# Patient Record
Sex: Female | Born: 1950 | Race: Black or African American | Hispanic: No | Marital: Married | Smoking: Current every day smoker
Health system: Southern US, Community
[De-identification: ages and names within clinical notes are randomized; demographics above are authoritative.]

## PROBLEM LIST (undated history)

## (undated) DIAGNOSIS — I10 Essential (primary) hypertension: Secondary | ICD-10-CM

## (undated) HISTORY — PX: ABDOMINAL HYSTERECTOMY: SHX81

---

## 2005-11-19 ENCOUNTER — Emergency Department (HOSPITAL_COMMUNITY): Admission: EM | Admit: 2005-11-19 | Discharge: 2005-11-19 | Payer: Self-pay | Admitting: Family Medicine

## 2005-11-19 ENCOUNTER — Emergency Department (HOSPITAL_COMMUNITY): Admission: EM | Admit: 2005-11-19 | Discharge: 2005-11-19 | Payer: Self-pay | Admitting: Emergency Medicine

## 2006-05-18 ENCOUNTER — Emergency Department (HOSPITAL_COMMUNITY): Admission: EM | Admit: 2006-05-18 | Discharge: 2006-05-18 | Payer: Self-pay | Admitting: Emergency Medicine

## 2010-05-10 ENCOUNTER — Emergency Department (HOSPITAL_COMMUNITY)
Admission: EM | Admit: 2010-05-10 | Discharge: 2010-05-10 | Payer: Self-pay | Source: Home / Self Care | Admitting: Family Medicine

## 2012-04-21 ENCOUNTER — Encounter (HOSPITAL_COMMUNITY): Payer: Self-pay | Admitting: *Deleted

## 2012-04-21 ENCOUNTER — Emergency Department (HOSPITAL_COMMUNITY)
Admission: EM | Admit: 2012-04-21 | Discharge: 2012-04-21 | Disposition: A | Discharge status: Home / Self Care | Payer: 59 | Attending: Emergency Medicine | Admitting: Emergency Medicine

## 2012-04-21 ENCOUNTER — Emergency Department (HOSPITAL_COMMUNITY): Payer: 59

## 2012-04-21 DIAGNOSIS — F17200 Nicotine dependence, unspecified, uncomplicated: Secondary | ICD-10-CM | POA: Insufficient documentation

## 2012-04-21 DIAGNOSIS — M25569 Pain in unspecified knee: Secondary | ICD-10-CM | POA: Insufficient documentation

## 2012-04-21 DIAGNOSIS — M25562 Pain in left knee: Secondary | ICD-10-CM

## 2012-04-21 DIAGNOSIS — I10 Essential (primary) hypertension: Secondary | ICD-10-CM | POA: Insufficient documentation

## 2012-04-21 HISTORY — DX: Essential (primary) hypertension: I10

## 2012-04-21 MED ORDER — IBUPROFEN 600 MG PO TABS: 600.0000 mg | ORAL_TABLET | Freq: Four times a day (QID) | ORAL | Status: DC | PRN

## 2012-04-21 MED ORDER — KETOROLAC TROMETHAMINE 60 MG/2ML IM SOLN
60.0000 mg | Freq: Once | INTRAMUSCULAR | Status: AC
  Administered 2012-04-21: 60 mg via INTRAMUSCULAR
  Filled 2012-04-21: qty 2

## 2012-04-21 NOTE — Progress Notes (Signed)
Orthopedic Tech Progress Note Patient Details:  Casey Levy 05/17/1951 [id number]  Ortho Devices Type of Ortho Device: Crutches;Knee Sleeve Ortho Device/Splint Location: right leg Ortho Device/Splint Interventions: Application   Nikki Dom 04/21/2012, 9:56 PM

## 2012-04-21 NOTE — ED Notes (Signed)
Pt is here with left knee pain and now hard to bear weight on it at all

## 2012-04-21 NOTE — ED Provider Notes (Signed)
History   This chart was scribed for Loren Racer, MD by Gerlean Ren, ED Scribe. This patient was seen in room TR09C/TR09C and the patient's care was started at 7:58 PM    CSN: [id number]  Arrival date & time 04/21/12  1758   First MD Initiated Contact with Patient 04/21/12 1946      Chief Complaint  Patient presents with  . Knee Pain     The history is provided by the patient. No language interpreter was used.   Casey Levy is a 61 y.o. female who presents to the Emergency Department complaining of 3-4 weeks of new, moderate, non-radiating left knee pain that has been worsening over past 2-3 days and is worsened by ambulation and mildly improved by rest.  Pt denies any injury, fall, or trauma as cause.  Pt has a h/o HTN.  Pt is a current everyday smoker and reports occasional alcohol use.   Past Medical History  Diagnosis Date  . Hypertension     Past Surgical History  Procedure Date  . Abdominal hysterectomy     No family history on file.  History  Substance Use Topics  . Smoking status: Current Every Day Smoker  . Smokeless tobacco: Not on file  . Alcohol Use: Yes     Comment: occ    No OB history provided.  Review of Systems  Musculoskeletal:       Left knee pain  Neurological: Negative for weakness and numbness.    Allergies  Review of patient's allergies indicates no known allergies.  Home Medications   Current Outpatient Rx  Name  Route  Sig  Dispense  Refill  . DICLOFENAC SODIUM ER 100 MG PO TB24   Oral   Take 100 mg by mouth daily.         Marland Kitchen HYDROCODONE-IBUPROFEN 5-200 MG PO TABS   Oral   Take 1 tablet by mouth every 8 (eight) hours as needed. For pain         . IBUPROFEN 600 MG PO TABS   Oral   Take 1 tablet (600 mg total) by mouth every 6 (six) hours as needed for pain.   30 tablet   0     BP 171/103  Pulse 87  Temp 97.6 F (36.4 C) (Oral)  Resp 18  SpO2 97%  Physical Exam  Nursing note and vitals  reviewed. Constitutional: She is oriented to person, place, and time. She appears well-developed and well-nourished.  HENT:  Head: Normocephalic and atraumatic.  Eyes: Conjunctivae normal and EOM are normal. Pupils are equal, round, and reactive to light.  Neck: Normal range of motion. Neck supple.  Cardiovascular: Normal rate, regular rhythm and normal heart sounds.        Distal pulses intact.  Pulmonary/Chest: Effort normal and breath sounds normal.  Abdominal: Soft. Bowel sounds are normal.  Musculoskeletal: Normal range of motion.       Crepitance with left knee ROM.  Mild tenderness around knee.  No obvious swelling or deformity.   Left calf soft and non-tender.  Neurological: She is alert and oriented to person, place, and time.  Skin: Skin is warm and dry.  Psychiatric: She has a normal mood and affect.    ED Course  Procedures (including critical care time) DIAGNOSTIC STUDIES: Oxygen Saturation is 97% on room air, adequate by my interpretation.    COORDINATION OF CARE: 8:01 PM- Patient informed of clinical course, understands medical decision-making process, and agrees with plan.  Ordered  IM Toradol and left knee XR.        Labs Reviewed - No data to display Dg Knee Complete 4 Views Left  04/21/2012  *RADIOLOGY REPORT*  Clinical Data: Pain weight bearing  LEFT KNEE - COMPLETE 4+ VIEW  Comparison: None.  Findings: No effusion. Negative for fracture, dislocation, or other acute abnormality.  Normal alignment and mineralization. No significant degenerative change.  Regional soft tissues unremarkable.  IMPRESSION:  Negative   Original Report Authenticated By: D. Andria Rhein, MD      1. Knee pain, left       MDM  I personally performed the services described in this documentation, which was scribed in my presence. The recorded information has been reviewed and is accurate.       Loren Racer, MD 04/21/12 2104

## 2014-04-05 IMAGING — CR DG KNEE COMPLETE 4+V*L*
4 series · 4 of 4 positions shown · non-contrast
Comparison: None.

CLINICAL DATA: Pain weight bearing

LEFT KNEE - COMPLETE 4+ VIEW

[t knee ap left]
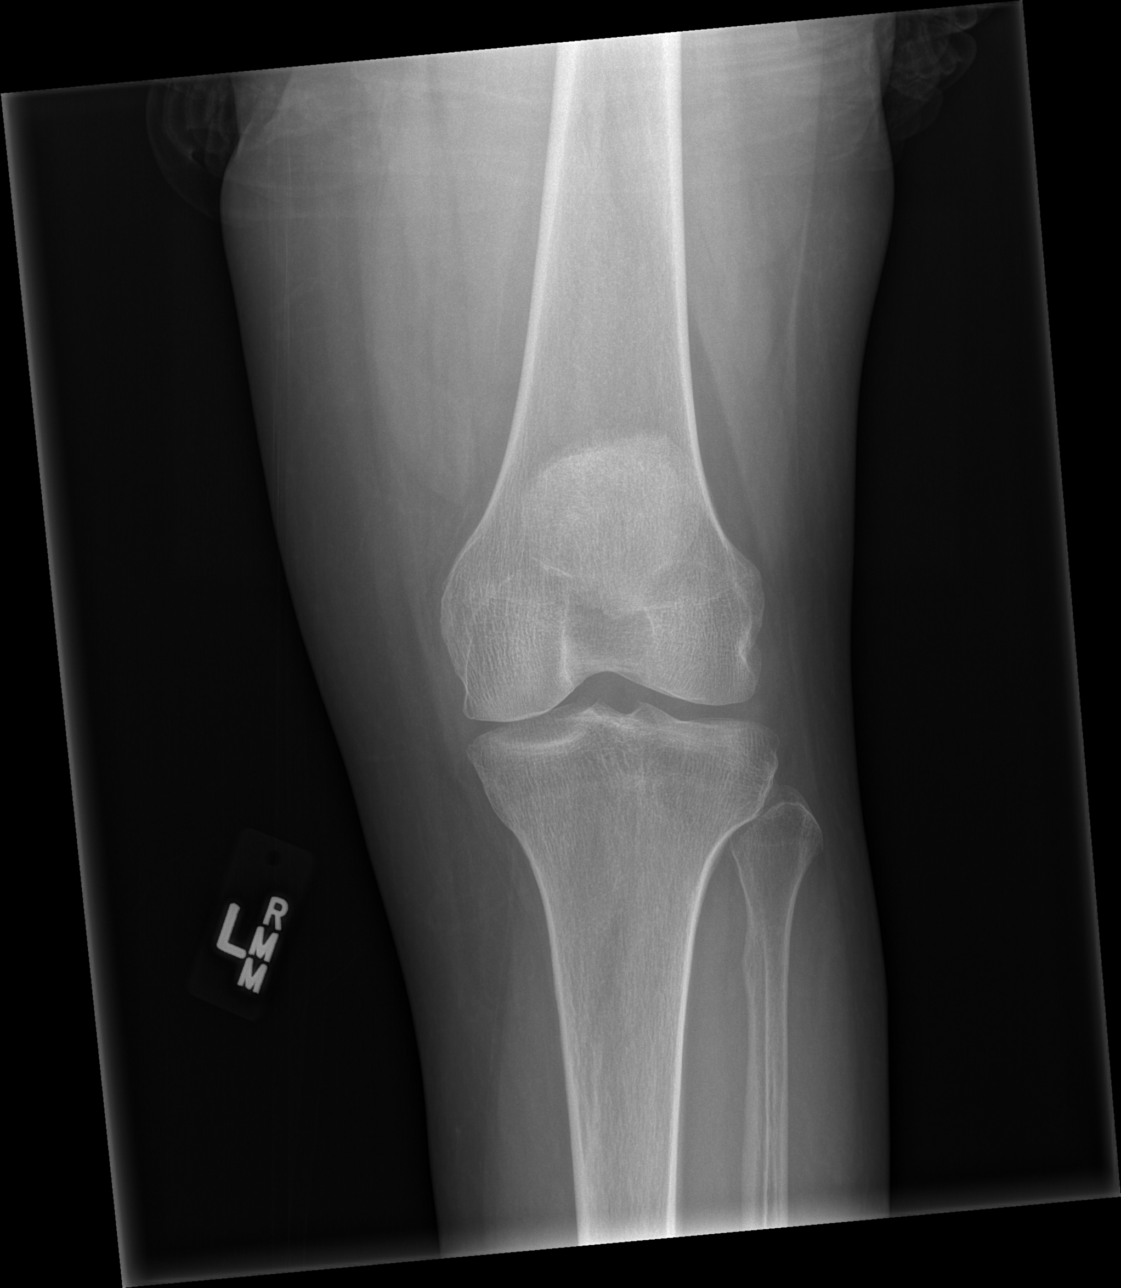

[t knee obl left (1 of 2)]
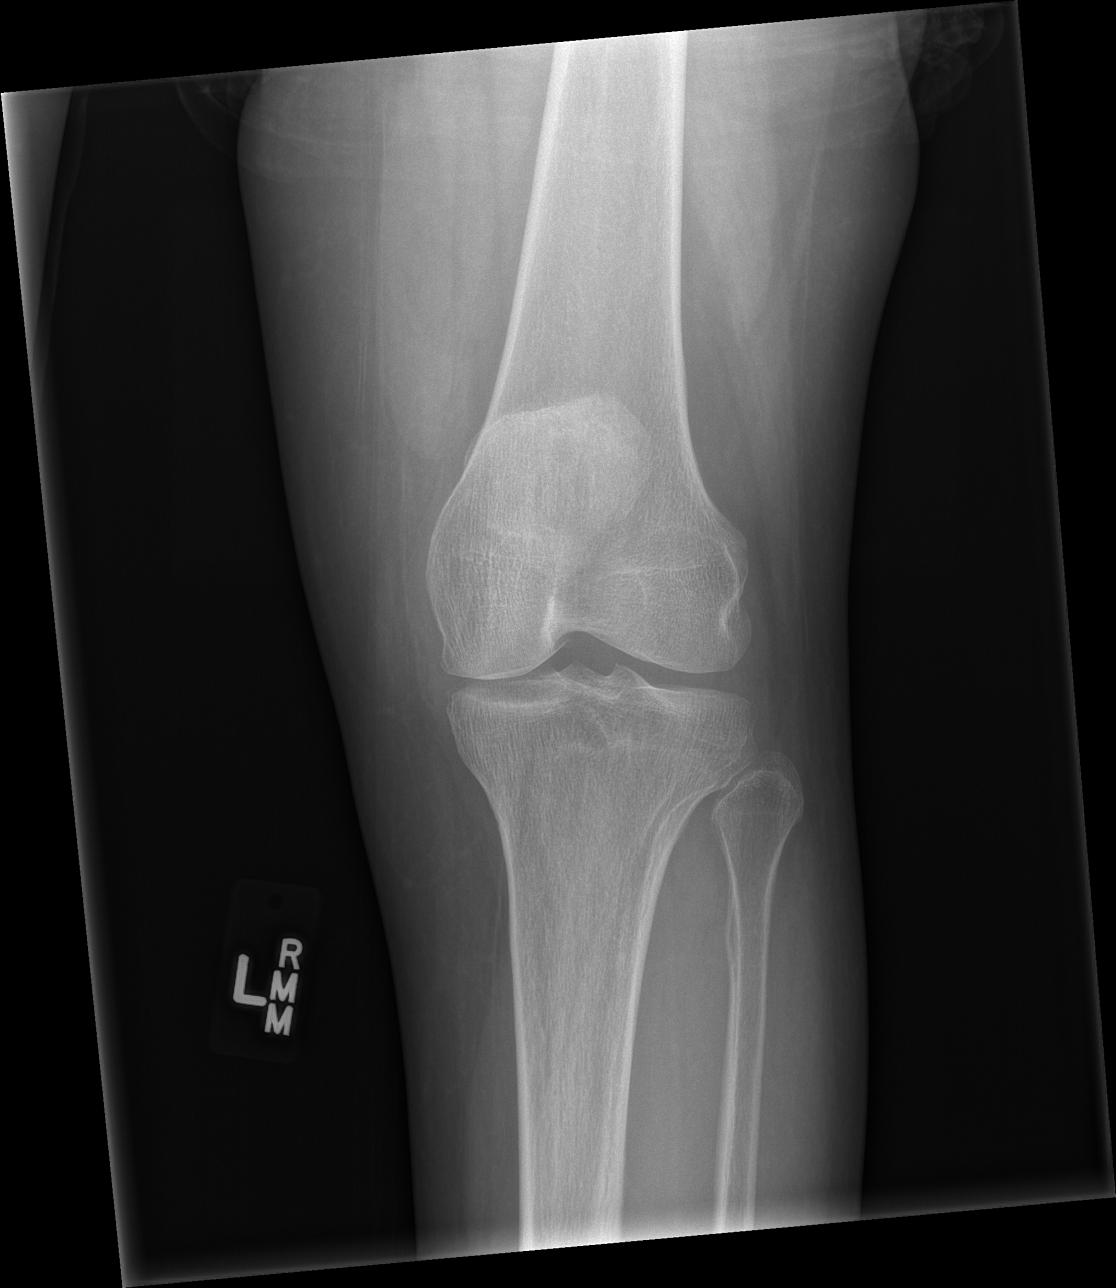

[t knee obl left (2 of 2)]
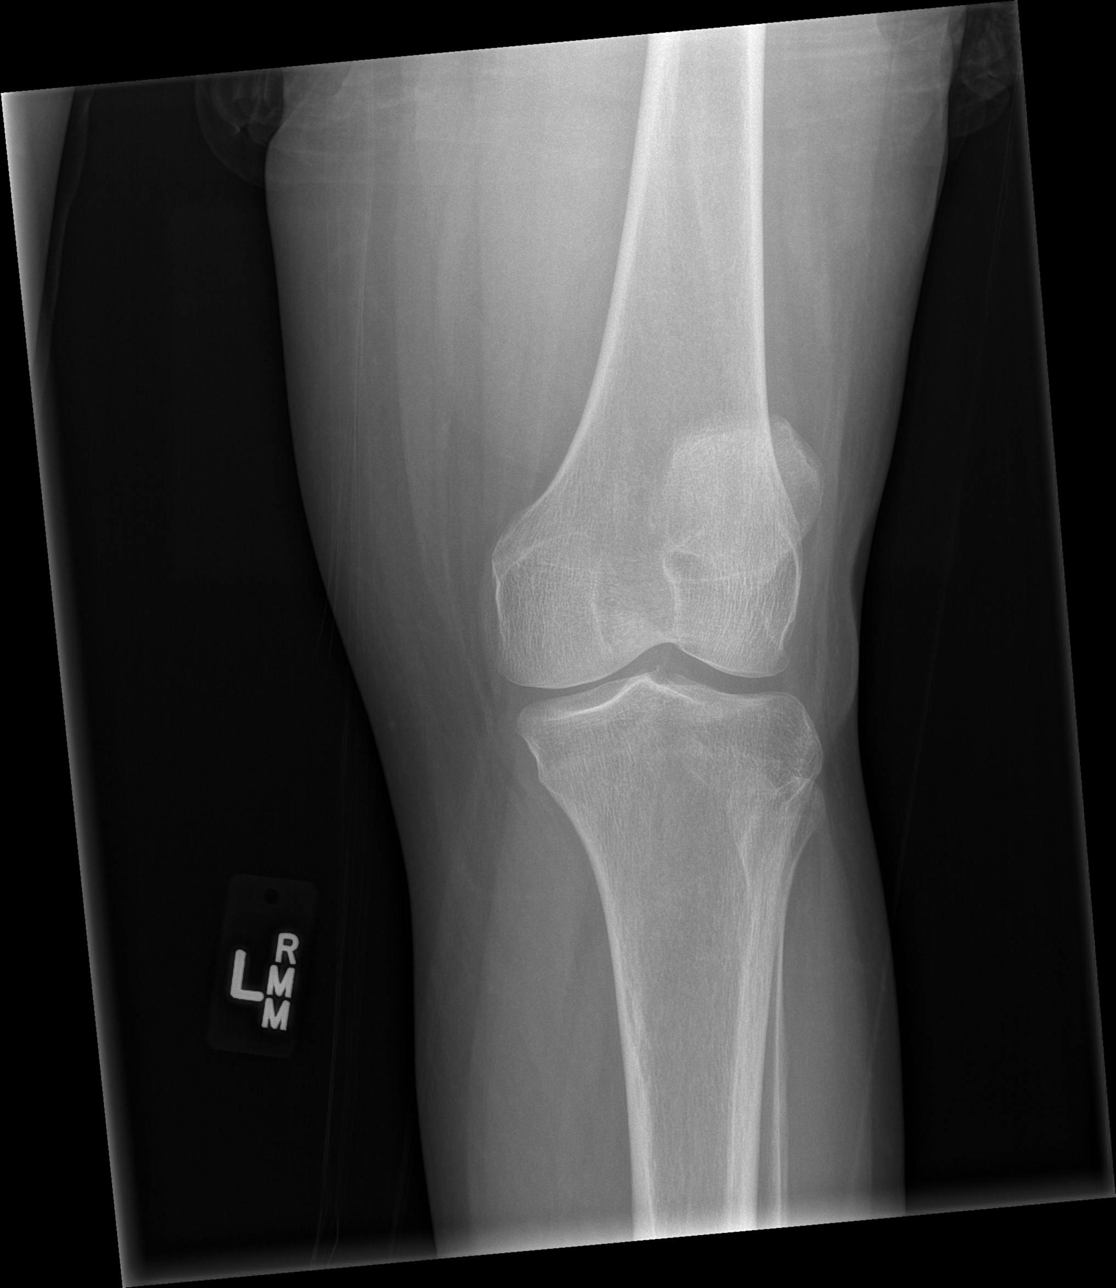

[t knee lat left]
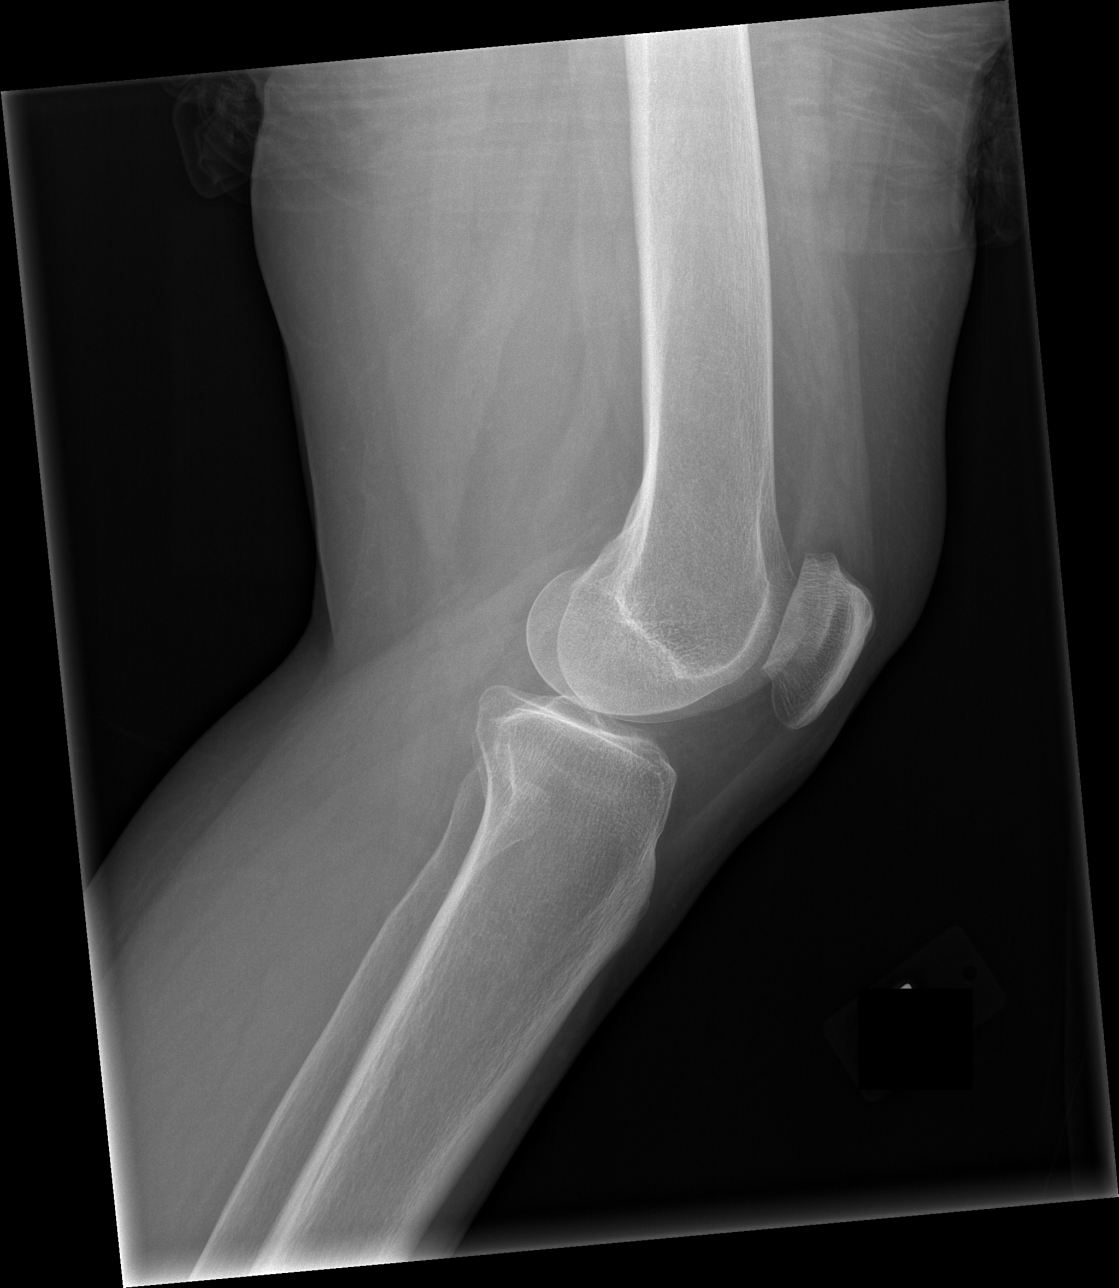

[4 of 4 positions shown; findings below may reference images not displayed]

FINDINGS: No effusion. Negative for fracture, dislocation, or other
acute abnormality.  Normal alignment and mineralization. No
significant degenerative change.  Regional soft tissues
unremarkable.
IMPRESSION: Negative

## 2015-07-30 ENCOUNTER — Encounter (HOSPITAL_COMMUNITY): Payer: Self-pay | Admitting: Emergency Medicine

## 2015-07-30 ENCOUNTER — Emergency Department (HOSPITAL_COMMUNITY)
Admission: EM | Admit: 2015-07-30 | Discharge: 2015-07-30 | Disposition: A | Discharge status: Home / Self Care | Payer: 59 | Attending: Emergency Medicine | Admitting: Emergency Medicine

## 2015-07-30 DIAGNOSIS — M79652 Pain in left thigh: Secondary | ICD-10-CM | POA: Diagnosis not present

## 2015-07-30 DIAGNOSIS — F17210 Nicotine dependence, cigarettes, uncomplicated: Secondary | ICD-10-CM | POA: Insufficient documentation

## 2015-07-30 DIAGNOSIS — I10 Essential (primary) hypertension: Secondary | ICD-10-CM | POA: Diagnosis not present

## 2015-07-30 DIAGNOSIS — R202 Paresthesia of skin: Secondary | ICD-10-CM | POA: Diagnosis present

## 2015-07-30 DIAGNOSIS — Z79899 Other long term (current) drug therapy: Secondary | ICD-10-CM | POA: Diagnosis not present

## 2015-07-30 MED ORDER — NAPROXEN 500 MG PO TABS: 500.0000 mg | ORAL_TABLET | Freq: Two times a day (BID) | ORAL | Status: DC

## 2015-07-30 MED ORDER — AMLODIPINE BESYLATE 5 MG PO TABS: 5.0000 mg | ORAL_TABLET | Freq: Every day | ORAL | Status: DC

## 2015-07-30 NOTE — ED Provider Notes (Signed)
CSN: [id number]     Arrival date & time 07/30/15  4098 History   First MD Initiated Contact with Patient 07/30/15 [phone]     Chief Complaint  Patient presents with  . Leg warmth      (Consider location/radiation/quality/duration/timing/severity/associated sxs/prior Treatment) HPI Comments: Patient is a 65 year old female with history of hypertension. She presents today with a two-week history of intermittent warm sensation to the inside of her left thigh. She denies any fevers or chills. She denies any injury or trauma. She denies any redness. She denies any bowel or bladder complaints and denies any weakness or numbness to her extremities.  The history is provided by the patient.    Past Medical History  Diagnosis Date  . Hypertension    Past Surgical History  Procedure Laterality Date  . Abdominal hysterectomy     History reviewed. No pertinent family history. Social History  Substance Use Topics  . Smoking status: Current Every Day Smoker -- 0.50 packs/day    Types: Cigarettes  . Smokeless tobacco: Never Used  . Alcohol Use: No     Comment: No alcohol since July 2015   OB History    No data available     Review of Systems  All other systems reviewed and are negative.     Allergies  Review of patient's allergies indicates no known allergies.  Home Medications   Prior to Admission medications   Medication Sig Start Date End Date Taking? Authorizing Provider  Diclofenac Sodium CR 100 MG 24 hr tablet Take 100 mg by mouth daily.    Historical Provider, MD  hydrocodone-ibuprofen (VICOPROFEN) 5-200 MG per tablet Take 1 tablet by mouth every 8 (eight) hours as needed. For pain    Historical Provider, MD  ibuprofen (ADVIL,MOTRIN) 600 MG tablet Take 1 tablet (600 mg total) by mouth every 6 (six) hours as needed for pain. 04/21/12   Loren Racer, MD   BP 172/123 mmHg  Pulse 102  Temp(Src) 97.9 F (36.6 C) (Oral)  Resp 20  SpO2 99% Physical Exam  Constitutional:  She is oriented to person, place, and time. She appears well-developed and well-nourished. No distress.  HENT:  Head: Normocephalic and atraumatic.  Neck: Normal range of motion. Neck supple.  Cardiovascular: Normal rate and regular rhythm.  Exam reveals no gallop and no friction rub.   No murmur heard. Pulmonary/Chest: Effort normal and breath sounds normal. No respiratory distress. She has no wheezes.  Abdominal: Soft. Bowel sounds are normal. She exhibits no distension. There is no tenderness.  Musculoskeletal: Normal range of motion.  The inside of the left thigh appears grossly normal. There is no redness or erythema. There is no fluctuance, swelling, or other abnormality. DP and PT pulses are intact. Motor and sensation are intact in the entire leg. There is swelling or tenderness, and Homans sign is absent.  Neurological: She is alert and oriented to person, place, and time.  Skin: Skin is warm and dry. She is not diaphoretic.  Nursing note and vitals reviewed.   ED Course  Procedures (including critical care time) Labs Review Labs Reviewed - No data to display  Imaging Review No results found. I have personally reviewed and evaluated these images and lab results as part of my medical decision-making.    MDM   Final diagnoses:  None    Patient presents with complaints of warm sensation to the inside of her left thigh. Her exam is essentially normal with good distal circulation and is neurologically  intact. I am uncertain as to what is causing her symptoms, however this will be treated with an anti-inflammatory, rest, and see her in return. I'm quite certain this is not a DVT. Her blood pressure is elevated here in the emergency department. She has a history of hypertension, however has not been taking her Cardizem because she does not like the way it makes her feel. I will have her stop this and change the prescription to amlodipine.    Geoffery Lyons, MD 07/30/15 [phone]

## 2015-07-30 NOTE — ED Notes (Signed)
Pt verbalized understanding of d/c instructions, prescriptions, and follow-up care. No further questions/concerns, VSS, ambulatory w/ steady gait (refused wheelchair) 

## 2015-07-30 NOTE — ED Notes (Signed)
Pt from home with c/o intermittent inner thigh warmth x 2 weeks, approx 5 times a day, lasting 4-5 seconds.  Denies any pain or tingling.  Pt reports returning headache s/p flu starting last night.  No other symptoms returned.  No warmness to thigh at this time.  Headache gone due to taking 1 g tylenol this morning around 3 am.  NAD, A&O.

## 2015-07-30 NOTE — Discharge Instructions (Signed)
Stop taking Cardizem.  Start taking amlodipine.  Naproxen twice daily as prescribed.  Follow up with her primary doctor if not improving in the next week, and return to the ER if symptoms significantly worsen or change.   Hypertension Hypertension, commonly called high blood pressure, is when the force of blood pumping through your arteries is too strong. Your arteries are the blood vessels that carry blood from your heart throughout your body. A blood pressure reading consists of a higher number over a lower number, such as 110/72. The higher number (systolic) is the pressure inside your arteries when your heart pumps. The lower number (diastolic) is the pressure inside your arteries when your heart relaxes. Ideally you want your blood pressure below 120/80. Hypertension forces your heart to work harder to pump blood. Your arteries may become narrow or stiff. Having untreated or uncontrolled hypertension can cause heart attack, stroke, kidney disease, and other problems. RISK FACTORS Some risk factors for high blood pressure are controllable. Others are not.  Risk factors you cannot control include:   Race. You may be at higher risk if you are African American.  Age. Risk increases with age.  Gender. Men are at higher risk than women before age 67 years. After age 46, women are at higher risk than men. Risk factors you can control include:  Not getting enough exercise or physical activity.  Being overweight.  Getting too much fat, sugar, calories, or salt in your diet.  Drinking too much alcohol. SIGNS AND SYMPTOMS Hypertension does not usually cause signs or symptoms. Extremely high blood pressure (hypertensive crisis) may cause headache, anxiety, shortness of breath, and nosebleed. DIAGNOSIS To check if you have hypertension, your health care provider will measure your blood pressure while you are seated, with your arm held at the level of your heart. It should be measured at  least twice using the same arm. Certain conditions can cause a difference in blood pressure between your right and left arms. A blood pressure reading that is higher than normal on one occasion does not mean that you need treatment. If it is not clear whether you have high blood pressure, you may be asked to return on a different day to have your blood pressure checked again. Or, you may be asked to monitor your blood pressure at home for 1 or more weeks. TREATMENT Treating high blood pressure includes making lifestyle changes and possibly taking medicine. Living a healthy lifestyle can help lower high blood pressure. You may need to change some of your habits. Lifestyle changes may include:  Following the DASH diet. This diet is high in fruits, vegetables, and whole grains. It is low in salt, red meat, and added sugars.  Keep your sodium intake below 2,300 mg per day.  Getting at least 30-45 minutes of aerobic exercise at least 4 times per week.  Losing weight if necessary.  Not smoking.  Limiting alcoholic beverages.  Learning ways to reduce stress. Your health care provider may prescribe medicine if lifestyle changes are not enough to get your blood pressure under control, and if one of the following is true:  You are 2-53 years of age and your systolic blood pressure is above 140.  You are 96 years of age or older, and your systolic blood pressure is above 150.  Your diastolic blood pressure is above 90.  You have diabetes, and your systolic blood pressure is over 140 or your diastolic blood pressure is over 90.  You have kidney  disease and your blood pressure is above 140/90.  You have heart disease and your blood pressure is above 140/90. Your personal target blood pressure may vary depending on your medical conditions, your age, and other factors. HOME CARE INSTRUCTIONS  Have your blood pressure rechecked as directed by your health care provider.   Take medicines only as  directed by your health care provider. Follow the directions carefully. Blood pressure medicines must be taken as prescribed. The medicine does not work as well when you skip doses. Skipping doses also puts you at risk for problems.  Do not smoke.   Monitor your blood pressure at home as directed by your health care provider. SEEK MEDICAL CARE IF:   You think you are having a reaction to medicines taken.  You have recurrent headaches or feel dizzy.  You have swelling in your ankles.  You have trouble with your vision. SEEK IMMEDIATE MEDICAL CARE IF:  You develop a severe headache or confusion.  You have unusual weakness, numbness, or feel faint.  You have severe chest or abdominal pain.  You vomit repeatedly.  You have trouble breathing. MAKE SURE YOU:   Understand these instructions.  Will watch your condition.  Will get help right away if you are not doing well or get worse.   This information is not intended to replace advice given to you by your health care provider. Make sure you discuss any questions you have with your health care provider.   Document Released: 05/21/2005 Document Revised: 10/05/2014 Document Reviewed: 03/13/2013 Elsevier Interactive Patient Education [employer]

## 2016-02-21 ENCOUNTER — Ambulatory Visit: Payer: Medicare Other | Admitting: Family Medicine

## 2016-06-13 ENCOUNTER — Encounter: Payer: Self-pay | Admitting: Student

## 2016-06-13 ENCOUNTER — Ambulatory Visit: Payer: Medicare Other | Admitting: Family Medicine

## 2016-06-13 ENCOUNTER — Ambulatory Visit (INDEPENDENT_AMBULATORY_CARE_PROVIDER_SITE_OTHER): Payer: 59 | Admitting: Student

## 2016-06-13 ENCOUNTER — Other Ambulatory Visit: Payer: Self-pay | Admitting: Family Medicine

## 2016-06-13 VITALS — BP 158/90 | HR 90 | Temp 98.1°F | Ht 63.0 in | Wt 176.0 lb

## 2016-06-13 DIAGNOSIS — F411 Generalized anxiety disorder: Secondary | ICD-10-CM | POA: Diagnosis not present

## 2016-06-13 DIAGNOSIS — Z0000 Encounter for general adult medical examination without abnormal findings: Secondary | ICD-10-CM | POA: Diagnosis not present

## 2016-06-13 DIAGNOSIS — I10 Essential (primary) hypertension: Secondary | ICD-10-CM | POA: Diagnosis not present

## 2016-06-13 MED ORDER — SERTRALINE HCL 25 MG PO TABS: 25.0000 mg | ORAL_TABLET | Freq: Every day | ORAL | 2 refills | Status: DC

## 2016-06-13 MED ORDER — ASPIRIN EC 81 MG PO TBEC: 81.0000 mg | DELAYED_RELEASE_TABLET | Freq: Every day | ORAL | 3 refills | Status: AC

## 2016-06-13 NOTE — Patient Instructions (Signed)
Follow up in 2 weeks for Depression Please take Aspirin, Amlodipine and Zoloft daily Please obtain your labs If you have qaestions or concerns, call the office at [phone]

## 2016-06-13 NOTE — Progress Notes (Signed)
   Subjective:    Patient ID: Casey Levy, female    DOB: Mar 18, 1951, 66 y.o.   MRN: [id number]   CC: establish care, anxiety, high blood pressure  HPI: 66 y/o female presenting to establish care. She reports a history of high blood pressure and reports worsening anxiety  Hypertension - previously treated with diltiazem and amlodipine 5 mg - she self d'ced the diltiazem one year ago but continues to take amlodipine - she denies headache, chest pain, SOB, changes in visin - she does not check her BP at home  Anxiety - she reports anxiety as a long term issue that has been progressively worsening - she feels her anxiety make sit difficult for her to perform at her job - she denies low mood or SI/HI but is interested in staring medication for this GAD 7- 18 PHQ9-3  Smoking status reviewed 0.5 ppd smoker  Review of Systems  Per HPI, else denies recent illness, fever,  abdominal pain, N/V/D  Past Medical History:  Diagnosis Date  . Hypertension    Past Surgical History:  Procedure Laterality Date  . ABDOMINAL HYSTERECTOMY     Family History  Problem Relation Age of Onset  . Diabetes Brother    OB Hx - [id number], s/p two term vaginal deliveries  Social History  Substance Use Topics  . Smoking status: Current Every Day Smoker    Packs/day: 0.50    Types: Cigarettes  . Smokeless tobacco: Never Used  . Alcohol use 4.2 - 6.0 oz/week    7 - 10 Glasses of wine per week   Works as a International aid/development workerCAT driver   Objective:  BP (!) 158/90   Pulse 90   Temp 98.1 F (36.7 C) (Oral)   Ht 5\' 3"  (1.6 m)   Wt 176 lb (79.8 kg)   SpO2 96%   BMI 31.18 kg/m  Vitals and nursing note reviewed  General: NAD Cardiac: RRR Respiratory: CTAB, normal effort Extremities: no edema or cyanosis. WWP. Skin: warm and dry, no rashes noted Neuro: alert and oriented, no focal deficits   Assessment & Plan:    Generalized anxiety disorder GAD7- 18 with patient reported impacted quality of life -  will start Zoloft 25 and follow up in 2 weeks - will offer BH to help  Essential hypertension BP elevated to 158/90 in the office, above goal while taking Norvasc 5 mg. She did take it this AM - will increase norvasc to 10 - BMP today - follow up in 1 week for BP check  Healthcare maintenance Will start aspirin today, lipid panel ordered - flu offered but she declined - due for mammo and colonoscopy, which she desired to discuss at later visits - due for hep C, Pneumnia vaccine, Dexa, Tdap - will continue to discuss these at future visits    Pamula Luther A. Kennon RoundsHaney MD, MS Family Medicine Resident PGY-3 Pager [phone]

## 2016-06-14 ENCOUNTER — Other Ambulatory Visit: Payer: Self-pay | Admitting: Student

## 2016-06-14 ENCOUNTER — Encounter: Payer: Self-pay | Admitting: Student

## 2016-06-14 DIAGNOSIS — I10 Essential (primary) hypertension: Secondary | ICD-10-CM | POA: Insufficient documentation

## 2016-06-14 DIAGNOSIS — Z0000 Encounter for general adult medical examination without abnormal findings: Secondary | ICD-10-CM | POA: Insufficient documentation

## 2016-06-14 DIAGNOSIS — F411 Generalized anxiety disorder: Secondary | ICD-10-CM | POA: Insufficient documentation

## 2016-06-14 MED ORDER — AMLODIPINE BESYLATE 5 MG PO TABS: 10.0000 mg | ORAL_TABLET | Freq: Every day | ORAL | 2 refills | Status: DC

## 2016-06-14 MED ORDER — AMLODIPINE BESYLATE 5 MG PO TABS: 5.0000 mg | ORAL_TABLET | Freq: Every day | ORAL | 2 refills | Status: DC

## 2016-06-14 NOTE — Telephone Encounter (Signed)
Patient came in stated [facility] [place] [address] [place] on amlodipine. Patient stated Dr. Was going to increase the dose on amlodipine.  Please follow up with patient [phone]

## 2016-06-14 NOTE — Telephone Encounter (Signed)
Norvasc called in to continue 10 mg. Patient called to inform her that this was being called in. She will need a BP check in one week,. Please call schedule one. Patient is aware

## 2016-06-14 NOTE — Assessment & Plan Note (Signed)
Will start aspirin today, lipid panel ordered - flu offered but she declined - due for mammo and colonoscopy, which she desired to discuss at later visits - due for hep C, Pneumnia vaccine, Dexa, Tdap - will continue to discuss these at future visits

## 2016-06-14 NOTE — Telephone Encounter (Signed)
Scheduled BP check for 1/18, pt is aware.

## 2016-06-14 NOTE — Telephone Encounter (Signed)
Looks like rx was not called in, please advise.

## 2016-06-14 NOTE — Assessment & Plan Note (Signed)
BP elevated to 158/90 in the office, above goal while taking Norvasc 5 mg. She did take it this AM - will increase norvasc to 10 - BMP today - follow up in 1 week for BP check

## 2016-06-14 NOTE — Assessment & Plan Note (Signed)
GAD7- 18 with patient reported impacted quality of life - will start Zoloft 25 and follow up in 2 weeks - will offer BH to help

## 2016-06-26 ENCOUNTER — Encounter: Payer: Self-pay | Admitting: Student

## 2016-06-27 ENCOUNTER — Ambulatory Visit (INDEPENDENT_AMBULATORY_CARE_PROVIDER_SITE_OTHER): Payer: 59 | Admitting: Family Medicine

## 2016-06-27 ENCOUNTER — Encounter: Payer: Self-pay | Admitting: Family Medicine

## 2016-06-27 DIAGNOSIS — F411 Generalized anxiety disorder: Secondary | ICD-10-CM

## 2016-06-27 DIAGNOSIS — I10 Essential (primary) hypertension: Secondary | ICD-10-CM | POA: Diagnosis not present

## 2016-06-27 NOTE — Progress Notes (Signed)
    Subjective:  Casey Levy is a 66 y.o. female who [facility] with a chief complaint of HTN.   HPI:  Hypertension Patient [facility]. BP was elevated at that time to 158/90 and patient's norvasc was increased from 5mg  to 10mg .    BP Readings from Last 3 Encounters:  06/27/16 140/90  06/13/16 (!) 158/90  07/30/15 (!) 172/123   Home BP monitoring-No Compliant with medications-yes, without side effects ROS-Denies any CP, HA, SOB, blurry vision, LE edema, transient weakness, orthopnea, PND.   Anxiety Stopped taking zoloft. Thinks that it was making her anxiety worse. Asking for xanax. Says that she has tried this in the past and it has helped with her symptoms.  ROS: Per HPI  PMH: Smoking history reviewed.   Objective:  Physical Exam: BP 140/90 (BP Location: Right Arm, Patient Position: Sitting, Cuff Size: Normal)   Pulse 94   Temp 97.8 F (36.6 C) (Oral)   Ht 5\' 3"  (1.6 m)   Wt 175 lb (79.4 kg)   SpO2 97%   BMI 31.00 kg/m   Gen: NAD, resting comfortably CV: RRR with no murmurs appreciated Pulm: NWOB, CTAB with no crackles, wheezes, or rhonchi MSK: no edema, cyanosis, or clubbing noted Skin: warm, dry Neuro: grossly normal, moves all extremities Psych: Normal affect and thought content  Assessment/Plan:  Essential hypertension BP at goal today. Continue norvasc 10mg  daily.  Generalized anxiety disorder Patient has stopped taking zoloft. Told patient that xanax was not a good option and that it would not be prescribed for her today. Consider starting another SSRI +/- buspar if symptoms continue to be worrisome. Asked patient to follow up within PCP within the next week for further management.   Katina Degreealeb M. Jimmey RalphParker, MD [facility] Medicine Resident PGY-3 06/27/2016 9:54 AM

## 2016-06-27 NOTE — Assessment & Plan Note (Signed)
Patient has stopped taking zoloft. Told patient that xanax was not a good option and that it would not be prescribed for her today. Consider starting another SSRI +/- buspar if symptoms continue to be worrisome. Asked patient to follow up within PCP within the next week for further management.

## 2016-06-27 NOTE — Assessment & Plan Note (Signed)
BP at goal today. Continue norvasc 10mg  daily.

## 2016-06-27 NOTE — Patient Instructions (Signed)
Your blood pressure looks good today.  Please come back to talk to Dr Kennon Roundshaney about your anxiety.  Take care,  Dr Jimmey RalphParker

## 2016-07-20 ENCOUNTER — Ambulatory Visit: Payer: 59 | Admitting: Student

## 2016-07-27 ENCOUNTER — Ambulatory Visit: Payer: 59 | Admitting: Student

## 2016-08-02 LAB — BASIC METABOLIC PANEL
BUN/Creatinine Ratio: 18 (ref 12–28)
BUN: 12 mg/dL (ref 8–27)
CALCIUM: 9.6 mg/dL (ref 8.7–10.3)
CHLORIDE: 102 mmol/L (ref 96–106)
CO2: 23 mmol/L (ref 18–29)
Creatinine, Ser: 0.67 mg/dL (ref 0.57–1.00)
GFR calc Af Amer: 105 mL/min/{1.73_m2} (ref >59)
GFR calc non Af Amer: 91 mL/min/{1.73_m2} (ref >59)
Glucose: 84 mg/dL (ref 65–99)
Potassium: 4.3 mmol/L (ref 3.5–5.2)
Sodium: 144 mmol/L (ref 134–144)

## 2016-08-02 LAB — PLEASE NOTE

## 2017-04-11 ENCOUNTER — Ambulatory Visit (INDEPENDENT_AMBULATORY_CARE_PROVIDER_SITE_OTHER): Payer: 59 | Admitting: Orthopedic Surgery

## 2017-04-11 ENCOUNTER — Encounter (INDEPENDENT_AMBULATORY_CARE_PROVIDER_SITE_OTHER): Payer: Self-pay | Admitting: Orthopedic Surgery

## 2017-04-11 ENCOUNTER — Encounter (INDEPENDENT_AMBULATORY_CARE_PROVIDER_SITE_OTHER): Payer: Self-pay | Admitting: Family

## 2017-04-11 ENCOUNTER — Ambulatory Visit (INDEPENDENT_AMBULATORY_CARE_PROVIDER_SITE_OTHER): Payer: 59

## 2017-04-11 DIAGNOSIS — M79672 Pain in left foot: Secondary | ICD-10-CM

## 2017-04-11 DIAGNOSIS — M722 Plantar fascial fibromatosis: Secondary | ICD-10-CM

## 2017-04-11 MED ORDER — METHYLPREDNISOLONE ACETATE 40 MG/ML IJ SUSP
40.0000 mg | INTRAMUSCULAR | Status: AC | PRN
  Administered 2017-04-11: 40 mg

## 2017-04-11 MED ORDER — LIDOCAINE HCL 1 % IJ SOLN
2.0000 mL | INTRAMUSCULAR | Status: AC | PRN
  Administered 2017-04-11: 2 mL

## 2017-04-11 NOTE — Progress Notes (Signed)
Office Visit Note   Patient: Casey Levy           Date of Birth: 1950/10/31           MRN: [id number] Visit Date: 04/11/2017              Requested by: Leland HerYoo, Elsia J, DO [address] [place], [place] [id number] PCP: Leland HerYoo, Elsia J, DO  Chief Complaint  Patient presents with  . Left Foot - Pain      HPI: Patient is a 66 year old woman seen today for evaluation of 3 week history of left heel pain. Worse in am, worse with start up. Pain with ambulation. Has tried Ibu without relief. Wonders if she needs new shoes. Has tried rolling foot over a frozen water bottle and heel cord stretching without relief. This is disabling for her, cannot drive or perform her work due to pain. States needs something done.  Assessment & Plan: Visit Diagnoses:  1. Pain of left heel   2. Plantar fasciitis     Plan: Depomedrol injection left heel today. Recommended sole orthotics and continued heel cord stretching. Follow up in office as needed.   Follow-Up Instructions: Return in about 4 weeks (around 05/09/2017), or if symptoms worsen or fail to improve.   Ortho Exam  Patient is alert, oriented, no adenopathy, well-dressed, normal affect, normal respiratory effort. On examination of left foot is point tender to origin of plantar fascia on left. Minimal pain with lateral compression of calcaneus. Dorsiflexion past neutral   Imaging: Xr Os Calcis Left  Result Date: 04/11/2017 Radiographs of left heel show no acute abnormality.  No fracture. Does have plantar spur.  No images are attached to the encounter.  Labs: No results found for: HGBA1C, ESRSEDRATE, CRP, LABURIC, REPTSTATUS, GRAMSTAIN, CULT, LABORGA  Orders:  Orders Placed This Encounter  Procedures  . XR Os Calcis Left   No orders of the defined types were placed in this encounter.    Procedures: Foot Inj Date/Time: 04/11/2017 11:34 AM Performed by: Adonis HugueninZamora, Ruthell Feigenbaum R, NP Authorized by: Adonis HugueninZamora, Jocob Dambach R, NP   Consent Given by:   Patient Site marked: the procedure site was marked   Timeout: prior to procedure the correct patient, procedure, and site was verified   Indications:  Fasciitis and pain Condition: Plantar Fasciitis   Location: left plantar fascia muscle   Prep: patient was prepped and draped in usual sterile fashion   Needle Size:  22 G Medications:  2 mL lidocaine 1 %; 40 mg methylPREDNISolone acetate 40 MG/ML Patient Tolerance:  Patient tolerated the procedure well with no immediate complications    Clinical Data: No additional findings.  ROS:  All other systems negative, except as noted in the HPI. Review of Systems  Constitutional: Negative for chills and fever.  Musculoskeletal: Positive for myalgias.  Neurological: Negative for weakness and numbness.    Objective: Vital Signs: There were no vitals taken for this visit.  Specialty Comments:  No specialty comments available.  PMFS History: Patient Active Problem List   Diagnosis Date Noted  . Essential hypertension 06/14/2016  . Generalized anxiety disorder 06/14/2016  . Healthcare maintenance 06/14/2016   Past Medical History:  Diagnosis Date  . Hypertension     Family History  Problem Relation Age of Onset  . Diabetes Brother     Past Surgical History:  Procedure Laterality Date  . ABDOMINAL HYSTERECTOMY     Social History   Occupational History  . Not on  file  Tobacco Use  . Smoking status: Current Every Day Smoker    Packs/day: 0.50    Types: Cigarettes  . Smokeless tobacco: Never Used  Substance and Sexual Activity  . Alcohol use: Yes    Alcohol/week: 4.2 - 6.0 oz    Types: 7 - 10 Glasses of wine per week  . Drug use: No  . Sexual activity: No

## 2019-09-03 ENCOUNTER — Ambulatory Visit: Payer: 59

## 2019-09-07 ENCOUNTER — Ambulatory Visit: Payer: Self-pay | Attending: Internal Medicine

## 2019-09-07 DIAGNOSIS — Z23 Encounter for immunization: Secondary | ICD-10-CM

## 2019-09-07 NOTE — Progress Notes (Signed)
   Covid-19 Vaccination Clinic  Name:  Flower Franko    MRN: [id number] DOB: Oct 19, 1950  09/07/2019  Ms. Pendergrass was observed post Covid-19 immunization for 15 minutes without incident. She was provided with Vaccine Information Sheet and instruction to access the V-Safe system.   Ms. Lottes was instructed to call 911 with any severe reactions post vaccine: Marland Kitchen Difficulty breathing  . Swelling of face and throat  . A fast heartbeat  . A bad rash all over body  . Dizziness and weakness   Immunizations Administered    Name Date Dose VIS Date Route   Pfizer COVID-19 Vaccine 09/07/2019  1:18 PM 0.3 mL 05/15/2019 Intramuscular   Manufacturer: [employer], [employer]   Lot: [id number]   NDC: [address]-0

## 2019-09-30 ENCOUNTER — Ambulatory Visit: Payer: Self-pay | Attending: Internal Medicine

## 2019-09-30 DIAGNOSIS — Z23 Encounter for immunization: Secondary | ICD-10-CM

## 2019-09-30 NOTE — Progress Notes (Signed)
   Covid-19 Vaccination Clinic  Name:  Casey Levy    MRN: [id number] DOB: 04-May-1951  09/30/2019  [name] was observed post Covid-19 immunization for 15 minutes without incident. She was provided with Vaccine Information Sheet and instruction to access the V-Safe system.   [name] was instructed to call 911 with any severe reactions post vaccine: Marland Kitchen Difficulty breathing  . Swelling of face and throat  . A fast heartbeat  . A bad rash all over body  . Dizziness and weakness   Immunizations Administered    Name Date Dose VIS Date Route   Pfizer COVID-19 Vaccine 09/30/2019  8:56 AM 0.3 mL 07/29/2018 Intramuscular   Manufacturer: [employer], [employer]   Lot: [id number]   NDC: [address]-4

## 2021-12-25 ENCOUNTER — Ambulatory Visit (HOSPITAL_COMMUNITY)
Admission: EM | Admit: 2021-12-25 | Discharge: 2021-12-25 | Disposition: A | Discharge status: Home / Self Care | Payer: Medicare Other | Attending: Physician Assistant | Admitting: Physician Assistant

## 2021-12-25 ENCOUNTER — Encounter (HOSPITAL_COMMUNITY): Payer: Self-pay

## 2021-12-25 DIAGNOSIS — I447 Left bundle-branch block, unspecified: Secondary | ICD-10-CM | POA: Diagnosis not present

## 2021-12-25 DIAGNOSIS — R9431 Abnormal electrocardiogram [ECG] [EKG]: Secondary | ICD-10-CM | POA: Diagnosis not present

## 2021-12-25 DIAGNOSIS — I10 Essential (primary) hypertension: Secondary | ICD-10-CM

## 2021-12-25 LAB — BASIC METABOLIC PANEL
Anion gap: 14 (ref 5–15)
BUN: 10 mg/dL (ref 8–23)
CO2: 26 mmol/L (ref 22–32)
Calcium: 9.7 mg/dL (ref 8.9–10.3)
Chloride: 100 mmol/L (ref 98–111)
Creatinine, Ser: 0.73 mg/dL (ref 0.44–1.00)
GFR, Estimated: >60 mL/min (ref >60)
Glucose, Bld: 89 mg/dL (ref 70–99)
Potassium: 3.7 mmol/L (ref 3.5–5.1)
Sodium: 140 mmol/L (ref 135–145)

## 2021-12-25 LAB — CBC
HCT: 44.9 % (ref 36.0–46.0)
Hemoglobin: 16.6 g/dL — ABNORMAL HIGH (ref 12.0–15.0)
MCH: 30.6 pg (ref 26.0–34.0)
MCHC: 37 g/dL — ABNORMAL HIGH (ref 30.0–36.0)
MCV: 82.7 fL (ref 80.0–100.0)
Platelets: 285 10*3/uL (ref 150–400)
RBC: 5.43 MIL/uL — ABNORMAL HIGH (ref 3.87–5.11)
RDW: 13.1 % (ref 11.5–15.5)
WBC: 5.9 10*3/uL (ref 4.0–10.5)
nRBC: 0 % (ref 0.0–0.2)

## 2021-12-25 MED ORDER — AMLODIPINE BESYLATE 10 MG PO TABS: 10.0000 mg | ORAL_TABLET | Freq: Every day | ORAL | 0 refills | Status: DC

## 2021-12-25 NOTE — Discharge Instructions (Signed)
Your EKG was abnormal.  It is very poor that he follow-up with a cardiologist within the next few days.  Please call to schedule appointment soon as possible.  I have restarted your blood pressure medication.  If you develop any symptoms at all including chest pain, shortness of breath, dizziness, headache, vision change you must go to the emergency room immediately.  We will try to establish with a primary care provider.

## 2021-12-25 NOTE — ED Provider Notes (Signed)
[facility]    CSN: [id number] Arrival date & time: 12/25/21  3086      History   Chief Complaint Chief Complaint  Patient presents with   Hypertension    HPI Casey Levy is a 71 y.o. female.   Patient presents today accompanied by her daughter who provide the majority of history.  Reports a prolonged history of hypertension that was previously managed with several medications.  She does not remember the names of these medicines.  She retired prior to the COVID-19 pandemic and has not been seen by a provider or been taking her medication since the pandemic began (several years).  She has had occasional headaches but denies any current symptoms.  She denies visual disturbance, headache, dizziness, chest pain, shortness of breath.  She does report significant anxiety as she will often get upset and worked up that even the most mild things including driving down the road with others.  She was previously followed by PCP but has not seen them in several years.  She is open to reestablishing with them.  She does report eating a significant amount of salt.  She is not currently exercising regularly.  She is open to restarting medication.    Past Medical History:  Diagnosis Date   Hypertension     Patient Active Problem List   Diagnosis Date Noted   Essential hypertension 06/14/2016   Generalized anxiety disorder 06/14/2016   Healthcare maintenance 06/14/2016    Past Surgical History:  Procedure Laterality Date   ABDOMINAL HYSTERECTOMY      OB History   No obstetric history on file.      Home Medications    Prior to Admission medications   Medication Sig Start Date End Date Taking? Authorizing Provider  amLODipine (NORVASC) 10 MG tablet Take 1 tablet (10 mg total) by mouth daily. 12/25/21   Kassius Battiste, Noberto Retort, PA-C    Family History Family History  Problem Relation Age of Onset   Diabetes Brother     Social History Social History   Tobacco Use   Smoking  status: Every Day    Packs/day: 0.50    Types: Cigarettes   Smokeless tobacco: Never  Substance Use Topics   Alcohol use: Yes    Alcohol/week: 7.0 - 10.0 standard drinks of alcohol    Types: 7 - 10 Glasses of wine per week   Drug use: No     Allergies   Patient has no known allergies.   Review of Systems Review of Systems  Constitutional:  Positive for activity change. Negative for appetite change, fatigue and fever.  Eyes:  Negative for visual disturbance.  Respiratory:  Negative for cough and shortness of breath.   Cardiovascular:  Negative for chest pain and palpitations.  Gastrointestinal:  Negative for abdominal pain, diarrhea, nausea and vomiting.  Neurological:  Negative for dizziness, weakness, light-headedness and headaches (Intermittent).  Psychiatric/Behavioral:  Negative for self-injury, sleep disturbance and suicidal ideas. The patient is nervous/anxious.      Physical Exam Triage Vital Signs ED Triage Vitals  Enc Vitals Group     BP 12/25/21 1003 (!) 200/136     Pulse Rate 12/25/21 1003 (!) 107     Resp 12/25/21 1003 18     Temp 12/25/21 1003 97.8 F (36.6 C)     Temp Source 12/25/21 1003 Oral     SpO2 12/25/21 1003 96 %     Weight --      Height --  Head Circumference --      Peak Flow --      Pain Score 12/25/21 1004 0     Pain Loc --      Pain Edu? --      Excl. in GC? --    No data found.  Updated Vital Signs BP (!) 197/127 (BP Location: Left Arm)   Pulse (!) 107   Temp 97.8 F (36.6 C) (Oral)   Resp 18   SpO2 96%   Visual Acuity Right Eye Distance:   Left Eye Distance:   Bilateral Distance:    Right Eye Near:   Left Eye Near:    Bilateral Near:     Physical Exam Vitals reviewed.  Constitutional:      General: She is awake. She is not in acute distress.    Appearance: Normal appearance. She is well-developed. She is not ill-appearing.     Comments: Very pleasant female appears stated age in no acute distress sitting  comfortably in exam room  HENT:     Head: Normocephalic and atraumatic.  Cardiovascular:     Rate and Rhythm: Normal rate and regular rhythm.     Heart sounds: Normal heart sounds, S1 normal and S2 normal. No murmur heard. Pulmonary:     Effort: Pulmonary effort is normal.     Breath sounds: Normal breath sounds. No wheezing, rhonchi or rales.     Comments: Clear to auscultation bilaterally Abdominal:     Palpations: Abdomen is soft.     Tenderness: There is no abdominal tenderness.     Comments: Benign abdominal exam  Musculoskeletal:     Right lower leg: No edema.     Left lower leg: No edema.  Psychiatric:        Behavior: Behavior is cooperative.      UC Treatments / Results  Labs (all labs ordered are listed, but only abnormal results are displayed) Labs Reviewed  CBC  BASIC METABOLIC PANEL    EKG   Radiology No results found.  Procedures Procedures (including critical care time)  Medications Ordered in UC Medications - No data to display  Initial Impression / Assessment and Plan / UC Course  I have reviewed the triage vital signs and the nursing notes.  Pertinent labs & imaging results that were available during my care of the patient were reviewed by me and considered in my medical decision making (see chart for details).     EKG obtained given elevated blood pressure and tachycardia which showed normal sinus rhythm with ventricular rate of 100 bpm with left bundle branch block.  No previous EKG to compare.  Discussed that typically this requires going to the emergency room to rule out acute heart condition.  Patient is currently asymptomatic and denies any chest pain or shortness of breath declined ER evaluation.  Recommended close follow-up with cardiology and she was given contact information for local provider with instruction to call to schedule appointment as soon as she leaves our clinic.  We will restart amlodipine.  Discussed that she will likely  require an additional blood pressure medication in the future and to monitor her blood pressure at home to keep a log for medication adjustment in the future.  We will try to establish her with a PCP but if she is unable to see them within a week she can return here for recheck and medication adjustment.  CBC and CMP obtained today-results pending.  She was encouraged to avoid NSAIDs, caffeine, sodium,  decongestants.  We discussed at length that if she has any symptoms including chest pain, heart racing, shortness of breath, headache, dizziness she needs to go immediately to the ER for further evaluation to which she expressed understanding.  Discussed case with Dr. Marlinda Mike who agreed with treatment plan.  Final Clinical Impressions(s) / UC Diagnoses   Final diagnoses:  Essential hypertension  Elevated blood pressure reading in office with diagnosis of hypertension  Abnormal EKG  LBBB (left bundle branch block)     Discharge Instructions      Your EKG was abnormal.  It is very poor that he follow-up with a cardiologist within the next few days.  Please call to schedule appointment soon as possible.  I have restarted your blood pressure medication.  If you develop any symptoms at all including chest pain, shortness of breath, dizziness, headache, vision change you must go to the emergency room immediately.  We will try to establish with a primary care provider.     ED Prescriptions     Medication Sig Dispense Auth. Provider   amLODipine (NORVASC) 10 MG tablet Take 1 tablet (10 mg total) by mouth daily. 90 tablet Valen Mascaro, Noberto Retort, PA-C      PDMP not reviewed this encounter.   Jeani Hawking, PA-C 12/25/21 1054

## 2021-12-25 NOTE — ED Triage Notes (Signed)
Pt c/o possible HTN, states hasn't seen her PCP or taking her medications in over a year. Pt c/o headaches, lightheaded, hot flashes, and increase anxiety. Took tylenol this morning with relief\.

## 2021-12-29 ENCOUNTER — Ambulatory Visit (HOSPITAL_COMMUNITY)
Admission: RE | Admit: 2021-12-29 | Discharge: 2021-12-29 | Disposition: A | Discharge status: Home / Self Care | Payer: Medicare Other | Source: Ambulatory Visit | Attending: Family Medicine | Admitting: Family Medicine

## 2021-12-29 ENCOUNTER — Encounter (HOSPITAL_COMMUNITY): Payer: Self-pay

## 2021-12-29 VITALS — BP 158/93 | HR 87 | Temp 98.0°F | Resp 16

## 2021-12-29 DIAGNOSIS — I10 Essential (primary) hypertension: Secondary | ICD-10-CM

## 2021-12-29 DIAGNOSIS — T887XXA Unspecified adverse effect of drug or medicament, initial encounter: Secondary | ICD-10-CM

## 2021-12-29 DIAGNOSIS — Z716 Tobacco abuse counseling: Secondary | ICD-10-CM

## 2021-12-29 MED ORDER — METOPROLOL SUCCINATE ER 25 MG PO TB24: 25.0000 mg | ORAL_TABLET | Freq: Every day | ORAL | 0 refills | Status: DC

## 2021-12-29 NOTE — ED Triage Notes (Signed)
Patient presents for follow up due to medication side effects.   Patient states she was prescribed amlodipine 10 mg which she is experiencing side effects from.   Patient states she is experiencing pedal edema, blurry vision, headache, dizziness, and palpitations after starting the medications.   Patient states the last time taking medication was yesterday at 0600.

## 2021-12-29 NOTE — Discharge Instructions (Addendum)
Hold Amlodipine. Start metoprolol and take daily with food. This medication will help with palpitations along with reducing your blood pressure. Keep follow-up with your primary care provider.  I have also placed a referral for you to be evaluated by cardiology.  Someone from their office will contact you directly to schedule an appointment.

## 2021-12-29 NOTE — ED Provider Notes (Signed)
[facility]    CSN: [id number] Arrival date & time: 12/29/21  0810      History   Chief Complaint Chief Complaint  Patient presents with   Follow-up   APPT 0830    HPI Casey Levy is a 71 y.o. female.   HPI Patient with a history of hypertension and generalized anxiety disorder, was seen in clinic on 12/25/2021, and restarted on amlodipine 10 mg which she had taken previously in 2017 when initially diagnosed with hypertension through 2018 when she last saw her primary care provider.  She had been lost to follow-up with PCP since 2018.  She presents today with a concern that her amlodipine is currently causing symptoms of pedal edema, blurry vision, headache dizziness and palpitations.  She last took her medication yesterday at 6:00 AM.  On arrival today her blood pressure is 158/93.  She is currently not having the dizziness or blurry vision.  She reports her feet are mostly swelling during the day but are currently not swelling.  She also endorses that she completely stopped smoking cigarettes and drinking wine which was a daily habit 4 days ago when she was seen here in clinic.  She is wondering if she is possibly experiencing some symptoms of withdrawals.  Denies any nausea or vomiting.  She is currently not using any nicotine supplement products to aid in smoking cessation.  Past Medical History:  Diagnosis Date   Hypertension     Patient Active Problem List   Diagnosis Date Noted   Essential hypertension 06/14/2016   Generalized anxiety disorder 06/14/2016   Healthcare maintenance 06/14/2016    Past Surgical History:  Procedure Laterality Date   ABDOMINAL HYSTERECTOMY      OB History   No obstetric history on file.      Home Medications    Prior to Admission medications   Medication Sig Start Date End Date Taking? Authorizing Provider  metoprolol succinate (TOPROL-XL) 25 MG 24 hr tablet Take 1 tablet (25 mg total) by mouth daily. 12/29/21  Yes Bing Neighbors, FNP    Family History Family History  Problem Relation Age of Onset   Diabetes Brother     Social History Social History   Tobacco Use   Smoking status: Every Day    Packs/day: 0.50    Types: Cigarettes   Smokeless tobacco: Never  Substance Use Topics   Alcohol use: Yes    Alcohol/week: 7.0 - 10.0 standard drinks of alcohol    Types: 7 - 10 Glasses of wine per week   Drug use: No     Allergies   Patient has no known allergies.   Review of Systems Review of Systems Pertinent negatives listed in HPI   Physical Exam Triage Vital Signs ED Triage Vitals  Enc Vitals Group     BP 12/29/21 0819 (!) 158/93     Pulse Rate 12/29/21 0819 87     Resp 12/29/21 0819 16     Temp --      Temp Source 12/29/21 0819 Oral     SpO2 12/29/21 0819 95 %     Weight --      Height --      Head Circumference --      Peak Flow --      Pain Score 12/29/21 0822 0     Pain Loc --      Pain Edu? --      Excl. in GC? --    No  data found.  Updated Vital Signs BP (!) 158/93 (BP Location: Left Arm)   Pulse 87   Temp 98 F (36.7 C) (Oral)   Resp 16   SpO2 95%   Visual Acuity Right Eye Distance:   Left Eye Distance:   Bilateral Distance:    Right Eye Near:   Left Eye Near:    Bilateral Near:     Physical Exam Constitutional:      Appearance: Normal appearance. She is not ill-appearing.  HENT:     Head: Normocephalic and atraumatic.     Nose: Nose normal.  Eyes:     Extraocular Movements: Extraocular movements intact.     Pupils: Pupils are equal, round, and reactive to light.  Cardiovascular:     Rate and Rhythm: Normal rate and regular rhythm.     Pulses: Normal pulses.  Pulmonary:     Effort: Pulmonary effort is normal.     Breath sounds: Normal breath sounds.  Musculoskeletal:        General: Normal range of motion.     Right lower leg: No edema.     Left lower leg: No edema.  Lymphadenopathy:     Cervical: No cervical adenopathy.  Skin:     General: Skin is warm and dry.  Neurological:     General: No focal deficit present.     Mental Status: She is alert and oriented to person, place, and time.     Motor: No weakness.     Coordination: Coordination normal.     Gait: Gait normal.  Psychiatric:        Attention and Perception: Attention normal.        Mood and Affect: Mood is anxious.        Speech: Speech normal.        Behavior: Behavior normal. Behavior is cooperative.        Cognition and Memory: Cognition normal.        Judgment: Judgment normal.      UC Treatments / Results  Labs (all labs ordered are listed, but only abnormal results are displayed) Labs Reviewed - No data to display  EKG   Radiology No results found.  Procedures Procedures (including critical care time)  Medications Ordered in UC Medications - No data to display  Initial Impression / Assessment and Plan / UC Course  I have reviewed the triage vital signs and the nursing notes.  Pertinent labs & imaging results that were available during my care of the patient were reviewed by me and considered in my medical decision making (see chart for details).   Casey Levy, 71 y.o female returns here to UC concerned regarding side effects of amlodipine. Patient had tolerated amlodipine in past, however is convinced she is experiencing side effects of dizziness, and visual changes, none of which she is experiencing today. Given patient level of anxiety and palpitation (EKG taken x 2 days ago, per EMR LBBB with HR100), a beta blocker would likely be more effective in hypertension and palpitation management. Patient has a BP cuff at home that measures her pulse, advised to hold metoprolol if heart rate decreases below 60 bpm. Keep a log of blood pressure and pulse readings take to new patient appointment. Patient would like once per day medication, agreed to trial metoprolol until she follow-up with PCP. Hold Amoldipine and not resume unless advised by  cardiology or new PCP. Patient was previously referred to cardiology during last UC visit however, referral is not viewable  on prior encounter, placed a referral to cardiology in outpatient orders as a stat referral. Patient advised to call if she has not been scheduled for an appointment within 3 business days. Congratulated on discontinuing smoking, information provided for enrollment in [place] quit to obtain few assistance with smoking cessation. Strict ER precautions given if her symptoms worsen or she experiences any Red Flag symptoms. Patient was very appreciative and verbalized understanding and agreement with plan. Final Clinical Impressions(s) / UC Diagnoses   Final diagnoses:  Essential hypertension  Medication side effects  Encounter for smoking cessation counseling     Discharge Instructions      Hold Amlodipine. Start metoprolol and take daily with food. This medication will help with palpitations along with reducing your blood pressure. Keep follow-up with your primary care provider.  I have also placed a referral for you to be evaluated by cardiology.  Someone from their office will contact you directly to schedule an appointment.     ED Prescriptions     Medication Sig Dispense Auth. Provider   metoprolol succinate (TOPROL-XL) 25 MG 24 hr tablet Take 1 tablet (25 mg total) by mouth daily. 30 tablet Scot Jun, FNP      PDMP not reviewed this encounter.   Scot Jun, FNP 01/05/22 [phone]

## 2021-12-29 NOTE — ED Triage Notes (Deleted)
Pt presents for right sided headache x 1 month. Pt reports right sided leg pain/ Pt states a history of joint pain. Pt is taking motrin.

## 2022-01-12 ENCOUNTER — Encounter: Payer: Self-pay | Admitting: Adult Health

## 2022-01-12 ENCOUNTER — Ambulatory Visit: Payer: 59 | Admitting: Nurse Practitioner

## 2022-01-15 ENCOUNTER — Ambulatory Visit (INDEPENDENT_AMBULATORY_CARE_PROVIDER_SITE_OTHER): Payer: Medicare Other | Admitting: Adult Health

## 2022-01-15 ENCOUNTER — Encounter: Payer: Self-pay | Admitting: Adult Health

## 2022-01-15 VITALS — BP 170/120 | HR 86 | Temp 97.6°F | Resp 18 | Ht 63.0 in | Wt 162.0 lb

## 2022-01-15 DIAGNOSIS — Z0000 Encounter for general adult medical examination without abnormal findings: Secondary | ICD-10-CM

## 2022-01-15 DIAGNOSIS — Z720 Tobacco use: Secondary | ICD-10-CM | POA: Diagnosis not present

## 2022-01-15 DIAGNOSIS — R768 Other specified abnormal immunological findings in serum: Secondary | ICD-10-CM

## 2022-01-15 DIAGNOSIS — I10 Essential (primary) hypertension: Secondary | ICD-10-CM

## 2022-01-15 DIAGNOSIS — F411 Generalized anxiety disorder: Secondary | ICD-10-CM | POA: Diagnosis not present

## 2022-01-15 DIAGNOSIS — R7689 Other specified abnormal immunological findings in serum: Secondary | ICD-10-CM

## 2022-01-15 DIAGNOSIS — F1010 Alcohol abuse, uncomplicated: Secondary | ICD-10-CM

## 2022-01-15 MED ORDER — ASPIRIN 81 MG PO TBEC: 81.0000 mg | DELAYED_RELEASE_TABLET | Freq: Every day | ORAL | 12 refills | Status: DC

## 2022-01-15 MED ORDER — LISINOPRIL 10 MG PO TABS: 10.0000 mg | ORAL_TABLET | Freq: Every day | ORAL | 3 refills | Status: DC

## 2022-01-15 MED ORDER — NICOTINE 21 MG/24HR TD PT24: 21.0000 mg | MEDICATED_PATCH | Freq: Every day | TRANSDERMAL | 3 refills | Status: DC

## 2022-01-15 MED ORDER — NICOTINE 21 MG/24HR TD PT24: 21.0000 mg | MEDICATED_PATCH | Freq: Every day | TRANSDERMAL | 0 refills | Status: DC

## 2022-01-15 MED ORDER — ESCITALOPRAM OXALATE 5 MG PO TABS: 5.0000 mg | ORAL_TABLET | Freq: Every day | ORAL | 3 refills | Status: DC

## 2022-01-15 MED ORDER — METOPROLOL SUCCINATE ER 25 MG PO TB24: 25.0000 mg | ORAL_TABLET | Freq: Every day | ORAL | 0 refills | Status: DC

## 2022-01-15 NOTE — Patient Instructions (Signed)
Preventive Care 65 Years and Older, Female Preventive care refers to lifestyle choices and visits with your health care provider that can promote health and wellness. Preventive care visits are also called wellness exams. What can I expect for my preventive care visit? Counseling Your health care provider may ask you questions about your: Medical history, including: Past medical problems. Family medical history. Pregnancy and menstrual history. History of falls. Current health, including: Memory and ability to understand (cognition). Emotional well-being. Home life and relationship well-being. Sexual activity and sexual health. Lifestyle, including: Alcohol, nicotine or tobacco, and drug use. Access to firearms. Diet, exercise, and sleep habits. Work and work environment. Sunscreen use. Safety issues such as seatbelt and bike helmet use. Physical exam Your health care provider will check your: Height and weight. These may be used to calculate your BMI (body mass index). BMI is a measurement that tells if you are at a healthy weight. Waist circumference. This measures the distance around your waistline. This measurement also tells if you are at a healthy weight and may help predict your risk of certain diseases, such as type 2 diabetes and high blood pressure. Heart rate and blood pressure. Body temperature. Skin for abnormal spots. What immunizations do I need?  Vaccines are usually given at various ages, according to a schedule. Your health care provider will recommend vaccines for you based on your age, medical history, and lifestyle or other factors, such as travel or where you work. What tests do I need? Screening Your health care provider may recommend screening tests for certain conditions. This may include: Lipid and cholesterol levels. Hepatitis C test. Hepatitis B test. HIV (human immunodeficiency virus) test. STI (sexually transmitted infection) testing, if you are at  risk. Lung cancer screening. Colorectal cancer screening. Diabetes screening. This is done by checking your blood sugar (glucose) after you have not eaten for a while (fasting). Mammogram. Talk with your health care provider about how often you should have regular mammograms. BRCA-related cancer screening. This may be done if you have a family history of breast, ovarian, tubal, or peritoneal cancers. Bone density scan. This is done to screen for osteoporosis. Talk with your health care provider about your test results, treatment options, and if necessary, the need for more tests. Follow these instructions at home: Eating and drinking  Eat a diet that includes fresh fruits and vegetables, whole grains, lean protein, and low-fat dairy products. Limit your intake of foods with high amounts of sugar, saturated fats, and salt. Take vitamin and mineral supplements as recommended by your health care provider. Do not drink alcohol if your health care provider tells you not to drink. If you drink alcohol: Limit how much you have to 0-1 drink a day. Know how much alcohol is in your drink. In the [place] one drink equals one 12 oz bottle of beer (355 mL), one 5 oz glass of wine (148 mL), or one 1 oz glass of hard liquor (44 mL). Lifestyle Brush your teeth every morning and night with fluoride toothpaste. Floss one time each day. Exercise for at least 30 minutes 5 or more days each week. Do not use any products that contain nicotine or tobacco. These products include cigarettes, chewing tobacco, and vaping devices, such as e-cigarettes. If you need help quitting, ask your health care provider. Do not use drugs. If you are sexually active, practice safe sex. Use a condom or other form of protection in order to prevent STIs. Take aspirin only as told by   your health care provider. Make sure that you understand how much to take and what form to take. Work with your health care provider to find out whether it  is safe and beneficial for you to take aspirin daily. Ask your health care provider if you need to take a cholesterol-lowering medicine (statin). Find healthy ways to manage stress, such as: Meditation, yoga, or listening to music. Journaling. Talking to a trusted person. Spending time with friends and family. Minimize exposure to UV radiation to reduce your risk of skin cancer. Safety Always wear your seat belt while driving or riding in a vehicle. Do not drive: If you have been drinking alcohol. Do not ride with someone who has been drinking. When you are tired or distracted. While texting. If you have been using any mind-altering substances or drugs. Wear a helmet and other protective equipment during sports activities. If you have firearms in your house, make sure you follow all gun safety procedures. What's next? Visit your health care provider once a year for an annual wellness visit. Ask your health care provider how often you should have your eyes and teeth checked. Stay up to date on all vaccines. This information is not intended to replace advice given to you by your health care provider. Make sure you discuss any questions you have with your health care provider. Document Revised: 11/16/2020 Document Reviewed: 11/16/2020 Elsevier Patient Education  2023 Elsevier Inc.  

## 2022-01-15 NOTE — Progress Notes (Signed)
Location:  [facility] clinic  Provider:   Arrie Eastern- Genia Hotter DNP  Code Status:   Full Code  Goals of Care:     01/15/2022    9:37 AM  Advanced Directives  Does Patient Have a Medical Advance Directive? No  Would patient like information on creating a medical advance directive? No - Patient declined     Chief Complaint  Patient presents with   Establish Care    New patient here to establish care. Patient has medication bottles at time of appointment. [facility] verified    HPI: Patient is a 71 y.o. female seen today to establish care with [facility]. She used to be seen at [facility]. She has quit taking her Amlodipine in 2018 because she "doesn't feel good when she takes it". She said that it causes her to lose her appetite. She has not seen a medical provider since 2018. She recently, 12/29/2021, went to [facility] for hypertension and was prescribed Toprol XL 25 mg daily. She smoked a pack a day/3 days and drinks a bottle of wine daily. She has a brother who was recently diagnosed with diabetes.  She complains of feeling anxious and gets nervous about everything. She stated that her brother died last month from sepsis at 34 years old. She does walking X 15 mins a day. She lives with her son in an apartment complex.   Past Medical History:  Diagnosis Date   Hypertension     Past Surgical History:  Procedure Laterality Date   ABDOMINAL HYSTERECTOMY      No Known Allergies  Outpatient Encounter Medications as of 01/15/2022  Medication Sig   metoprolol succinate (TOPROL-XL) 25 MG 24 hr tablet Take 1 tablet (25 mg total) by mouth daily.   No facility-administered encounter medications on file as of 01/15/2022.    Review of Systems:  Review of Systems  Constitutional:  Negative for appetite change, chills, fatigue and fever.  HENT:  Negative for congestion, hearing loss, rhinorrhea and sore throat.   Eyes: Negative.   Respiratory:  Negative for cough, shortness of  breath and wheezing.   Cardiovascular:  Negative for chest pain, palpitations and leg swelling.  Gastrointestinal:  Negative for abdominal pain, constipation, diarrhea, nausea and vomiting.  Genitourinary:  Negative for dysuria.  Musculoskeletal:  Negative for arthralgias, back pain and myalgias.  Skin:  Negative for color change, rash and wound.  Neurological:  Negative for dizziness, weakness and headaches.  Psychiatric/Behavioral:  Negative for agitation and behavioral problems. The patient is nervous/anxious.     Health Maintenance  Topic Date Due   Pneumonia Vaccine 48+ Years old (1 - PCV) Never done   Hepatitis C Screening  Never done   COLONOSCOPY (Pts 45-55yrs Insurance coverage will need to be confirmed)  Never done   MAMMOGRAM  Never done   Zoster Vaccines- Shingrix (1 of 2) Never done   DEXA SCAN  Never done   COVID-19 Vaccine (3 - Pfizer series) 11/25/2019   INFLUENZA VACCINE  01/02/2022   TETANUS/TDAP  01/21/2024   HPV VACCINES  Aged Out    Physical Exam: Vitals:   01/15/22 0938  BP: (!) 170/120  Pulse: 86  Resp: 18  Temp: 97.6 F (36.4 C)  SpO2: 95%  Weight: 162 lb (73.5 kg)  Height: 5\' 3"  (1.6 m)   Body mass index is 28.7 kg/m. Physical Exam Constitutional:      Appearance: Normal appearance.  HENT:     Head: Normocephalic  and atraumatic.     Nose: Nose normal.     Mouth/Throat:     Mouth: Mucous membranes are moist.  Eyes:     Conjunctiva/sclera: Conjunctivae normal.  Cardiovascular:     Rate and Rhythm: Normal rate and regular rhythm.  Pulmonary:     Effort: Pulmonary effort is normal.     Breath sounds: Normal breath sounds.  Abdominal:     General: Bowel sounds are normal.     Palpations: Abdomen is soft.  Musculoskeletal:        General: Normal range of motion.     Cervical back: Normal range of motion.  Skin:    General: Skin is warm and dry.  Neurological:     General: No focal deficit present.     Mental Status: She is alert and  oriented to person, place, and time.  Psychiatric:        Mood and Affect: Mood normal.        Behavior: Behavior normal.        Thought Content: Thought content normal.        Judgment: Judgment normal.    Labs reviewed: Basic Metabolic Panel: Recent Labs    12/25/21 1020  NA 140  K 3.7  CL 100  CO2 26  GLUCOSE 89  BUN 10  CREATININE 0.73  CALCIUM 9.7   Liver Function Tests: No results for input(s): "AST", "ALT", "ALKPHOS", "BILITOT", "PROT", "ALBUMIN" in the last 8760 hours. No results for input(s): "LIPASE", "AMYLASE" in the last 8760 hours. No results for input(s): "AMMONIA" in the last 8760 hours. CBC: Recent Labs    12/25/21 1020  WBC 5.9  HGB 16.6*  HCT 44.9  MCV 82.7  PLT 285   Lipid Panel: No results for input(s): "CHOL", "HDL", "LDLCALC", "TRIG", "CHOLHDL", "LDLDIRECT" in the last 8760 hours. No results found for: "HGBA1C"  Procedures since last visit: No results found.  Assessment/Plan  1. Essential hypertension -   BP today is 170/120, denies chest pain -  will add Lisinopril together with Toprol XL -  instructed to get BP before taking medications and log -  will follow up with cardiology on 01/30/22 - lisinopril (ZESTRIL) 10 MG tablet; Take 1 tablet (10 mg total) by mouth daily.  Dispense: 90 tablet; Refill: 3 - metoprolol succinate (TOPROL-XL) 25 MG 24 hr tablet; Take 1 tablet (25 mg total) by mouth daily.  Dispense: 30 tablet; Refill: 0 - aspirin EC 81 MG tablet; Take 1 tablet (81 mg total) by mouth daily. Swallow whole.  Dispense: 30 tablet; Refill: 12  2. Generalized anxiety disorder - escitalopram (LEXAPRO) 5 MG tablet; Take 1 tablet (5 mg total) by mouth daily.  Dispense: 30 tablet; Refill: 3  3. Routine adult health maintenance - Lipid Panel - Hepatic Function Panel - Hep B Surface Antibody - Hep C Antibody  4. Tobacco abuse disorder -  has not smoked since 12/29/21 and requested to have Nicotine patch - nicotine (NICODERM CQ - DOSED  IN MG/24 HOURS) 21 mg/24hr patch; Place 1 patch (21 mg total) onto the skin daily.  Dispense: 28 patch; Refill: 3  5.  Alcohol abuse -  takes a bottle of wine daily -  will check liver panel  Labs/tests ordered:  lipid panel, hep B and C and liver panel. She does not want to do A1c and HIV testing at this time  Next appt:  recommended in a month

## 2022-01-16 LAB — HEPATIC FUNCTION PANEL
AG Ratio: 1 (calc) (ref 1.0–2.5)
AST: 94 U/L — ABNORMAL HIGH (ref 10–35)
Bilirubin, Direct: 0.2 mg/dL (ref 0.0–0.2)
Globulin: 3.8 g/dL (calc) — ABNORMAL HIGH (ref 1.9–3.7)
Indirect Bilirubin: 0.6 mg/dL (calc) (ref 0.2–1.2)
Total Protein: 7.6 g/dL (ref 6.1–8.1)

## 2022-01-16 LAB — HEPATITIS C ANTIBODY: Hepatitis C Ab: REACTIVE — AB

## 2022-01-16 LAB — LIPID PANEL
Cholesterol: 193 mg/dL (ref <200)
LDL Cholesterol (Calc): 122 mg/dL (calc) — ABNORMAL HIGH
Total CHOL/HDL Ratio: 3.4 (calc) (ref <5.0)

## 2022-01-16 NOTE — Progress Notes (Signed)
Hep B surface antibody and hep C antibody are both reactive. Did she get Hep B vaccine before?   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 With elevated  AST and ALT (liver enzymes) which is thought to be due to wine intake but can also be associated with Hep C. Will need to repeat liver panel in a month and will need to decrease if not discontinue wine intake.

## 2022-01-16 NOTE — Progress Notes (Signed)
AST and ALT (liver enzymes) are both elevated and thought to be due to wine intake. Can be repeated in 3 months to follow up. Cholesterol, HDL and triglycerides within normal but LDL elevated at 122 with normal level of <100. This can be repeated in 3 months to follow up. Exercise can help  lower LDL Mediterranean diet, plant-based diet, low carb diet and avoidance of trans fats

## 2022-01-18 LAB — HEPATITIS B SURFACE ANTIBODY,QUALITATIVE: Hep B S Ab: REACTIVE — AB

## 2022-01-18 LAB — HCV RNA,QUANTITATIVE REAL TIME PCR
HCV Quantitative Log: 6.23 Log IU/mL — ABNORMAL HIGH
HCV RNA, PCR, QN: 1690000 IU/mL — ABNORMAL HIGH

## 2022-01-18 LAB — HEPATIC FUNCTION PANEL
ALT: 120 U/L — ABNORMAL HIGH (ref 6–29)
Albumin: 3.8 g/dL (ref 3.6–5.1)
Alkaline phosphatase (APISO): 71 U/L (ref 37–153)
Total Bilirubin: 0.8 mg/dL (ref 0.2–1.2)

## 2022-01-18 LAB — LIPID PANEL
HDL: 57 mg/dL (ref >=50)
Non-HDL Cholesterol (Calc): 136 mg/dL (calc) — ABNORMAL HIGH (ref <130)
Triglycerides: 50 mg/dL (ref <150)

## 2022-01-19 ENCOUNTER — Other Ambulatory Visit: Payer: Self-pay | Admitting: Adult Health

## 2022-01-19 ENCOUNTER — Telehealth: Payer: Self-pay | Admitting: *Deleted

## 2022-01-19 MED ORDER — METOPROLOL SUCCINATE ER 50 MG PO TB24: 50.0000 mg | ORAL_TABLET | Freq: Every day | ORAL | 3 refills | Status: DC

## 2022-01-19 NOTE — Progress Notes (Signed)
Toprol XL 25 mg was increased to 50 mg daily. Lisinopril was discontinued due to headache.

## 2022-01-19 NOTE — Addendum Note (Signed)
Addended by: Kenard Gower C on: 01/19/2022 12:32 PM   Modules accepted: Orders

## 2022-01-19 NOTE — Telephone Encounter (Signed)
Patient called with some concerns:  1.)Stated that she cannot take the Lisinopril 10mg . Having side effects of Headache, Nausea, Aches in legs and No Appetite.  Patient is requesting something else she can take instead. Stated that the Metoprolol is working well.    2.)Stated that the Lexapro is keeping her up at night and she has also stopped that.   Please Advise.

## 2022-01-19 NOTE — Telephone Encounter (Signed)
Medina-Vargas, Monina C, NP  [facility] Clinical Pool 16 minutes ago (9:22 AM)    Discontinue Toprol XL 25 mg daily. I sent prescription to [facility] for Toprol XL 50 mg daily. Since she has been having headache with Lisinopril, pls discontinue using it. Since Lexapro has been keeping her awake at night,  please discontinue it.      Patient Notified and agreed.

## 2022-01-19 NOTE — Progress Notes (Signed)
1. Additional testing with Hep C virus RNA quantitative real time PCR was high and possibly  having current infection. Will refer to GI  2.  Hep C and B can be transmitted through sexual contact, sharing needles and contaminated equipment during an invasive procedure.  3. Trans fatty acids are in commercial baked goods such as cookies, cakes and deep fried foods. A clue to their presence are the words partially hydrogenated" on the list of package ingredients.

## 2022-01-30 ENCOUNTER — Ambulatory Visit: Payer: Medicare Other | Admitting: Interventional Cardiology

## 2022-02-15 ENCOUNTER — Ambulatory Visit: Payer: Medicare Other | Admitting: Adult Health

## 2022-02-15 ENCOUNTER — Ambulatory Visit: Payer: 59 | Admitting: Adult Health

## 2022-02-27 ENCOUNTER — Encounter: Payer: Self-pay | Admitting: Adult Health

## 2022-02-27 ENCOUNTER — Ambulatory Visit (INDEPENDENT_AMBULATORY_CARE_PROVIDER_SITE_OTHER): Payer: Medicare Other | Admitting: Adult Health

## 2022-02-27 VITALS — BP 180/110 | HR 76 | Temp 97.5°F | Resp 18 | Ht 63.0 in | Wt 160.0 lb

## 2022-02-27 DIAGNOSIS — I10 Essential (primary) hypertension: Secondary | ICD-10-CM

## 2022-02-27 DIAGNOSIS — K5901 Slow transit constipation: Secondary | ICD-10-CM | POA: Diagnosis not present

## 2022-02-27 MED ORDER — LISINOPRIL-HYDROCHLOROTHIAZIDE 20-25 MG PO TABS: 1.0000 | ORAL_TABLET | Freq: Every day | ORAL | 0 refills | Status: DC

## 2022-02-27 MED ORDER — HYDROCHLOROTHIAZIDE 25 MG PO TABS: 25.0000 mg | ORAL_TABLET | Freq: Every day | ORAL | 0 refills | Status: DC

## 2022-02-27 MED ORDER — SENNA-DOCUSATE SODIUM 8.6-50 MG PO TABS: 2.0000 | ORAL_TABLET | Freq: Every day | ORAL | 3 refills | Status: DC

## 2022-02-27 MED ORDER — LISINOPRIL-HYDROCHLOROTHIAZIDE 20-25 MG PO TABS: 1.0000 | ORAL_TABLET | Freq: Every day | ORAL | 3 refills | Status: DC

## 2022-02-27 NOTE — Patient Instructions (Signed)
Hypertension, Adult High blood pressure (hypertension) is when the force of blood pumping through the arteries is too strong. The arteries are the blood vessels that carry blood from the heart throughout the body. Hypertension forces the heart to work harder to pump blood and may cause arteries to become narrow or stiff. Untreated or uncontrolled hypertension can lead to a heart attack, heart failure, a stroke, kidney disease, and other problems. A blood pressure reading consists of a higher number over a lower number. Ideally, your blood pressure should be below 120/80. The first ("top") number is called the systolic pressure. It is a measure of the pressure in your arteries as your heart beats. The second ("bottom") number is called the diastolic pressure. It is a measure of the pressure in your arteries as the heart relaxes. What are the causes? The exact cause of this condition is not known. There are some conditions that result in high blood pressure. What increases the risk? Certain factors may make you more likely to develop high blood pressure. Some of these risk factors are under your control, including: Smoking. Not getting enough exercise or physical activity. Being overweight. Having too much fat, sugar, calories, or salt (sodium) in your diet. Drinking too much alcohol. Other risk factors include: Having a personal history of heart disease, diabetes, high cholesterol, or kidney disease. Stress. Having a family history of high blood pressure and high cholesterol. Having obstructive sleep apnea. Age. The risk increases with age. What are the signs or symptoms? High blood pressure may not cause symptoms. Very high blood pressure (hypertensive crisis) may cause: Headache. Fast or irregular heartbeats (palpitations). Shortness of breath. Nosebleed. Nausea and vomiting. Vision changes. Severe chest pain, dizziness, and seizures. How is this diagnosed? This condition is diagnosed by  measuring your blood pressure while you are seated, with your arm resting on a flat surface, your legs uncrossed, and your feet flat on the floor. The cuff of the blood pressure monitor will be placed directly against the skin of your upper arm at the level of your heart. Blood pressure should be measured at least twice using the same arm. Certain conditions can cause a difference in blood pressure between your right and left arms. If you have a high blood pressure reading during one visit or you have normal blood pressure with other risk factors, you may be asked to: Return on a different day to have your blood pressure checked again. Monitor your blood pressure at home for 1 week or longer. If you are diagnosed with hypertension, you may have other blood or imaging tests to help your health care provider understand your overall risk for other conditions. How is this treated? This condition is treated by making healthy lifestyle changes, such as eating healthy foods, exercising more, and reducing your alcohol intake. You may be referred for counseling on a healthy diet and physical activity. Your health care provider may prescribe medicine if lifestyle changes are not enough to get your blood pressure under control and if: Your systolic blood pressure is above 130. Your diastolic blood pressure is above 80. Your personal target blood pressure may vary depending on your medical conditions, your age, and other factors. Follow these instructions at home: Eating and drinking  Eat a diet that is high in fiber and potassium, and low in sodium, added sugar, and fat. An example of this eating plan is called the DASH diet. DASH stands for Dietary Approaches to Stop Hypertension. To eat this way: Eat   plenty of fresh fruits and vegetables. Try to fill one half of your plate at each meal with fruits and vegetables. Eat whole grains, such as whole-wheat pasta, brown rice, or whole-grain bread. Fill about one  fourth of your plate with whole grains. Eat or drink low-fat dairy products, such as skim milk or low-fat yogurt. Avoid fatty cuts of meat, processed or cured meats, and poultry with skin. Fill about one fourth of your plate with lean proteins, such as fish, chicken without skin, beans, eggs, or tofu. Avoid pre-made and processed foods. These tend to be higher in sodium, added sugar, and fat. Reduce your daily sodium intake. Many people with hypertension should eat less than 1,500 mg of sodium a day. Do not drink alcohol if: Your health care provider tells you not to drink. You are pregnant, may be pregnant, or are planning to become pregnant. If you drink alcohol: Limit how much you have to: 0-1 drink a day for women. 0-2 drinks a day for men. Know how much alcohol is in your drink. In the [place] one drink equals one 12 oz bottle of beer (355 mL), one 5 oz glass of wine (148 mL), or one 1 oz glass of hard liquor (44 mL). Lifestyle  Work with your health care provider to maintain a healthy body weight or to lose weight. Ask what an ideal weight is for you. Get at least 30 minutes of exercise that causes your heart to beat faster (aerobic exercise) most days of the week. Activities may include walking, swimming, or biking. Include exercise to strengthen your muscles (resistance exercise), such as Pilates or lifting weights, as part of your weekly exercise routine. Try to do these types of exercises for 30 minutes at least 3 days a week. Do not use any products that contain nicotine or tobacco. These products include cigarettes, chewing tobacco, and vaping devices, such as e-cigarettes. If you need help quitting, ask your health care provider. Monitor your blood pressure at home as told by your health care provider. Keep all follow-up visits. This is important. Medicines Take over-the-counter and prescription medicines only as told by your health care provider. Follow directions carefully. Blood  pressure medicines must be taken as prescribed. Do not skip doses of blood pressure medicine. Doing this puts you at risk for problems and can make the medicine less effective. Ask your health care provider about side effects or reactions to medicines that you should watch for. Contact a health care provider if you: Think you are having a reaction to a medicine you are taking. Have headaches that keep coming back (recurring). Feel dizzy. Have swelling in your ankles. Have trouble with your vision. Get help right away if you: Develop a severe headache or confusion. Have unusual weakness or numbness. Feel faint. Have severe pain in your chest or abdomen. Vomit repeatedly. Have trouble breathing. These symptoms may be an emergency. Get help right away. Call 911. Do not wait to see if the symptoms will go away. Do not drive yourself to the hospital. Summary Hypertension is when the force of blood pumping through your arteries is too strong. If this condition is not controlled, it may put you at risk for serious complications. Your personal target blood pressure may vary depending on your medical conditions, your age, and other factors. For most people, a normal blood pressure is less than 120/80. Hypertension is treated with lifestyle changes, medicines, or a combination of both. Lifestyle changes include losing weight, eating a healthy,   low-sodium diet, exercising more, and limiting alcohol. This information is not intended to replace advice given to you by your health care provider. Make sure you discuss any questions you have with your health care provider. Document Revised: 03/28/2021 Document Reviewed: 03/28/2021 Elsevier Patient Education  2023 [employer] Inc.  

## 2022-02-27 NOTE — Progress Notes (Signed)
[facility]  Provider:   Durenda Age DNP  Code Status:  Full Code  Goals of Care:     01/15/2022    9:37 AM  Advanced Directives  Does Patient Have a Medical Advance Directive? No  Would patient like information on creating a medical advance directive? No - Patient declined     Chief Complaint  Patient presents with   Follow-up    Patient is here for a 27M F/U for HTN stopped using nicotine patch due to making BP high Would like something different that wont elevate BP BP medication not regulating BP    HPI: Patient is a 71 y.o. female seen today for an acute visit for uncontrolled hypertension. She was recently started on Lisinopril but complained of having headache so it was discontinued and Toprol XL was increased from 25 mg to 50 mg daily. BP today 180/110. She now complains that she doesn't feel good taking Toprol XL, makes her gassy and bloated. She stopped taking Nicotine patch because she feels that it causes her BP to go up. She stopped smoking now. She stopped taking Lexapro, too. She said that she "feels different" when she takes the Lexapro, can't sleep. She denies depression.  She complains today of constipation.  Past Medical History:  Diagnosis Date   Hypertension     Past Surgical History:  Procedure Laterality Date   ABDOMINAL HYSTERECTOMY      No Known Allergies  Outpatient Encounter Medications as of 02/27/2022  Medication Sig   aspirin EC 81 MG tablet Take 1 tablet (81 mg total) by mouth daily. Swallow whole.   metoprolol succinate (TOPROL-XL) 50 MG 24 hr tablet Take 1 tablet (50 mg total) by mouth daily. Take with or immediately following a meal.   [DISCONTINUED] nicotine (NICODERM CQ - DOSED IN MG/24 HOURS) 21 mg/24hr patch Place 1 patch (21 mg total) onto the skin daily.   No facility-administered encounter medications on file as of 02/27/2022.    Review of Systems:  Review of Systems  Constitutional:  Negative for appetite change,  chills, fatigue and fever.  HENT:  Negative for congestion, hearing loss, rhinorrhea and sore throat.   Eyes: Negative.   Respiratory:  Negative for cough, shortness of breath and wheezing.   Cardiovascular:  Negative for chest pain, palpitations and leg swelling.  Gastrointestinal:  Positive for constipation. Negative for abdominal pain, diarrhea, nausea and vomiting.  Genitourinary:  Negative for dysuria.  Musculoskeletal:  Negative for arthralgias, back pain and myalgias.  Skin:  Negative for color change, rash and wound.  Neurological:  Negative for dizziness, weakness and headaches.  Psychiatric/Behavioral:  Negative for behavioral problems. The patient is not nervous/anxious.     Health Maintenance  Topic Date Due   Pneumonia Vaccine 49+ Years old (1 - PCV) Never done   COLONOSCOPY (Pts 45-49yrs Insurance coverage will need to be confirmed)  Never done   MAMMOGRAM  Never done   Zoster Vaccines- Shingrix (1 of 2) Never done   DEXA SCAN  Never done   COVID-19 Vaccine (3 - Pfizer series) 11/25/2019   INFLUENZA VACCINE  Never done   TETANUS/TDAP  01/21/2024   Hepatitis C Screening  Completed   HPV VACCINES  Aged Out    Physical Exam: Vitals:   02/27/22 0902  BP: (!) 180/110  Pulse: 76  Resp: 18  Temp: (!) 97.5 F (36.4 C)  TempSrc: Temporal  SpO2: 97%  Weight: 160 lb (72.6 kg)  Height: $Remove'5\' 3"'NmSTjkX$  (1.6  m)   Body mass index is 28.34 kg/m. Physical Exam Constitutional:      Appearance: Normal appearance.  HENT:     Head: Normocephalic and atraumatic.     Nose: Nose normal.     Mouth/Throat:     Mouth: Mucous membranes are moist.  Eyes:     Conjunctiva/sclera: Conjunctivae normal.  Cardiovascular:     Rate and Rhythm: Normal rate and regular rhythm.  Pulmonary:     Effort: Pulmonary effort is normal.     Breath sounds: Normal breath sounds.  Abdominal:     General: Bowel sounds are normal.     Palpations: Abdomen is soft.  Musculoskeletal:        General: Normal  range of motion.     Cervical back: Normal range of motion.  Skin:    General: Skin is warm and dry.  Neurological:     General: No focal deficit present.     Mental Status: She is alert and oriented to person, place, and time.  Psychiatric:        Mood and Affect: Mood normal.        Behavior: Behavior normal.        Thought Content: Thought content normal.        Judgment: Judgment normal.     Labs reviewed: Basic Metabolic Panel: Recent Labs    12/25/21 1020  NA 140  K 3.7  CL 100  CO2 26  GLUCOSE 89  BUN 10  CREATININE 0.73  CALCIUM 9.7   Liver Function Tests: Recent Labs    01/15/22 1032  AST 94*  ALT 120*  BILITOT 0.8  PROT 7.6   No results for input(s): "LIPASE", "AMYLASE" in the last 8760 hours. No results for input(s): "AMMONIA" in the last 8760 hours. CBC: Recent Labs    12/25/21 1020  WBC 5.9  HGB 16.6*  HCT 44.9  MCV 82.7  PLT 285   Lipid Panel: Recent Labs    01/15/22 1032  CHOL 193  HDL 57  LDLCALC 122*  TRIG 50  CHOLHDL 3.4   No results found for: "HGBA1C"  Procedures since last visit: No results found.  Assessment/Plan  1. Uncontrolled hypertension -  discontinue Toprol XL due to gas/bloating -  will start on HCTZ -  instructed to check BP daily and log - hydrochlorothiazide (HYDRODIURIL) 25 MG tablet; Take 1 tablet (25 mg total) by mouth daily.  Dispense: 30 tablet; Refill: 0 - BMP with eGFR([facility]); Future  2. Slow transit constipation - sennosides-docusate sodium (SENOKOT-S) 8.6-50 MG tablet; Take 2 tablets by mouth daily.  Dispense: 120 tablet; Refill: 3   Labs/tests ordered:  BMP with GFR  Next appt:  Vas needed/3 months

## 2022-03-02 ENCOUNTER — Ambulatory Visit: Payer: Medicare Other | Admitting: Interventional Cardiology

## 2022-03-08 ENCOUNTER — Encounter: Payer: Self-pay | Admitting: *Deleted

## 2022-03-15 ENCOUNTER — Ambulatory Visit (INDEPENDENT_AMBULATORY_CARE_PROVIDER_SITE_OTHER): Payer: Medicare Other | Admitting: Adult Health

## 2022-03-15 ENCOUNTER — Encounter: Payer: Self-pay | Admitting: Adult Health

## 2022-03-15 VITALS — BP 160/102 | HR 100 | Temp 97.9°F | Ht 63.0 in | Wt 157.4 lb

## 2022-03-15 DIAGNOSIS — G8929 Other chronic pain: Secondary | ICD-10-CM

## 2022-03-15 DIAGNOSIS — M25562 Pain in left knee: Secondary | ICD-10-CM | POA: Diagnosis not present

## 2022-03-15 DIAGNOSIS — I10 Essential (primary) hypertension: Secondary | ICD-10-CM | POA: Diagnosis not present

## 2022-03-15 MED ORDER — AMLODIPINE BESYLATE 10 MG PO TABS: 10.0000 mg | ORAL_TABLET | Freq: Every day | ORAL | 3 refills | Status: AC

## 2022-03-15 MED ORDER — HYDROCHLOROTHIAZIDE 12.5 MG PO TABS: 12.5000 mg | ORAL_TABLET | Freq: Every day | ORAL | 3 refills | Status: AC

## 2022-03-15 MED ORDER — BIOFREEZE 4 % EX GEL: 1.0000 "application " | Freq: Four times a day (QID) | CUTANEOUS | Status: AC | PRN

## 2022-03-15 NOTE — Progress Notes (Signed)
[facility]  Provider: Durenda Age DNP  Code Status:  Full Code  Goals of Care:     03/15/2022    9:14 AM  Advanced Directives  Does Patient Have a Medical Advance Directive? No  Would patient like information on creating a medical advance directive? No - Patient declined     Chief Complaint  Patient presents with   Follow-up    Patient is here for 2 week follow up on Hypertension   Health Maintenance    Discussed the need for care gaps and health maintenance things    HPI: Patient is a 71 y.o. female seen today for an acute visit for hypertension follow up. BP today is 160/102. She is happy and more relax today "because my blood pressure is improving". She was started on HCTZ 2 weeks ago. She said that she has been urinating more. She has lost 2 lbs in 2 weeks. She complains of left knee pain, 3/10, worse at night. No edema noted. She denies trauma . She does not exercise except when she goes to the grocery store where she walks around.    Past Medical History:  Diagnosis Date   Hypertension     Past Surgical History:  Procedure Laterality Date   ABDOMINAL HYSTERECTOMY      Allergies  Allergen Reactions   Lisinopril Other (See Comments)    Headache   Toprol Xl [Metoprolol] Other (See Comments)    Gas, bloating    Outpatient Encounter Medications as of 03/15/2022  Medication Sig   hydrochlorothiazide (HYDRODIURIL) 25 MG tablet Take 1 tablet (25 mg total) by mouth daily.   [DISCONTINUED] sennosides-docusate sodium (SENOKOT-S) 8.6-50 MG tablet Take 2 tablets by mouth daily.   [DISCONTINUED] aspirin EC 81 MG tablet Take 1 tablet (81 mg total) by mouth daily. Swallow whole.   No facility-administered encounter medications on file as of 03/15/2022.    Review of Systems:  Review of Systems  Constitutional:  Negative for appetite change, chills, fatigue and fever.  HENT:  Negative for congestion, hearing loss, rhinorrhea and sore throat.   Eyes:  Negative.   Respiratory:  Negative for cough, shortness of breath and wheezing.   Cardiovascular:  Negative for chest pain, palpitations and leg swelling.  Gastrointestinal:  Negative for abdominal pain, constipation, diarrhea, nausea and vomiting.  Genitourinary:  Negative for dysuria.  Musculoskeletal:  Negative for arthralgias, back pain and myalgias.  Skin:  Negative for color change, rash and wound.  Neurological:  Negative for dizziness, weakness and headaches.  Psychiatric/Behavioral:  Negative for behavioral problems. The patient is not nervous/anxious.     Health Maintenance  Topic Date Due   Pneumonia Vaccine 12+ Years old (1 - PCV) Never done   COLONOSCOPY (Pts 45-39yr Insurance coverage will need to be confirmed)  Never done   MAMMOGRAM  Never done   Zoster Vaccines- Shingrix (1 of 2) Never done   DEXA SCAN  Never done   COVID-19 Vaccine (3 - Pfizer series) 11/25/2019   INFLUENZA VACCINE  Never done   TETANUS/TDAP  01/21/2024   Hepatitis C Screening  Completed   HPV VACCINES  Aged Out    Physical Exam: Vitals:   03/15/22 0910  BP: (!) 160/102  Pulse: 100  Temp: 97.9 F (36.6 C)  TempSrc: Temporal  SpO2: 97%  Weight: 157 lb 6.4 oz (71.4 kg)  Height: _0  (1.6 m)   Body mass index is 27.88 kg/m. Physical Exam Constitutional:      Appearance:  Normal appearance.  HENT:     Head: Normocephalic and atraumatic.     Nose: Nose normal.     Mouth/Throat:     Mouth: Mucous membranes are moist.  Eyes:     Conjunctiva/sclera: Conjunctivae normal.  Cardiovascular:     Rate and Rhythm: Normal rate and regular rhythm.  Pulmonary:     Effort: Pulmonary effort is normal.     Breath sounds: Normal breath sounds.  Abdominal:     General: Bowel sounds are normal.     Palpations: Abdomen is soft.  Musculoskeletal:        General: Normal range of motion.     Cervical back: Normal range of motion.  Skin:    General: Skin is warm and dry.  Neurological:      General: No focal deficit present.     Mental Status: She is alert and oriented to person, place, and time.  Psychiatric:        Mood and Affect: Mood normal.        Behavior: Behavior normal.        Thought Content: Thought content normal.        Judgment: Judgment normal.     Labs reviewed: Basic Metabolic Panel: Recent Labs    12/25/21 1020  NA 140  K 3.7  CL 100  CO2 26  GLUCOSE 89  BUN 10  CREATININE 0.73  CALCIUM 9.7   Liver Function Tests: Recent Labs    01/15/22 1032  AST 94*  ALT 120*  BILITOT 0.8  PROT 7.6   No results for input(s): "LIPASE", "AMYLASE" in the last 8760 hours. No results for input(s): "AMMONIA" in the last 8760 hours. CBC: Recent Labs    12/25/21 1020  WBC 5.9  HGB 16.6*  HCT 44.9  MCV 82.7  PLT 285   Lipid Panel: Recent Labs    01/15/22 1032  CHOL 193  HDL 57  LDLCALC 122*  TRIG 50  CHOLHDL 3.4   No results found for: "HGBA1C"  Procedures since last visit: No results found.  Assessment/Plan  1. Essential hypertension -  complained that she urinates more -  decrease HCTZ 25 mg daily to 12.5 mg daily -  add Amlodipine which she has taken before - BMP with eGFR([facility]) - amLODipine (NORVASC) 10 MG tablet; Take 1 tablet (10 mg total) by mouth daily.  Dispense: 90 tablet; Refill: 3 - hydrochlorothiazide (HYDRODIURIL) 12.5 MG tablet; Take 1 tablet (12.5 mg total) by mouth daily.  Dispense: 90 tablet; Refill: 3  2. Chronic pain of left knee -  apply heating pad X 20 minutes TID as needed - Menthol, Topical Analgesic, (BIOFREEZE) 4 % GEL; Apply 1 application  topically 4 (four) times daily as needed.  Dispense: 90 g   Labs/tests ordered:  BMP  Next appt:  3 months/as needed

## 2022-03-15 NOTE — Patient Instructions (Signed)

## 2022-03-16 LAB — BASIC METABOLIC PANEL WITH GFR
BUN: 18 mg/dL (ref 7–25)
CO2: 28 mmol/L (ref 20–32)
Calcium: 10.1 mg/dL (ref 8.6–10.4)
Chloride: 101 mmol/L (ref 98–110)
Creat: 0.74 mg/dL (ref 0.60–1.00)
Glucose, Bld: 93 mg/dL (ref 65–99)
Potassium: 4 mmol/L (ref 3.5–5.3)
Sodium: 141 mmol/L (ref 135–146)
eGFR: 85 mL/min/{1.73_m2} (ref >=60)

## 2022-03-23 NOTE — Progress Notes (Signed)
BMP normal, no new order.

## 2022-03-27 ENCOUNTER — Other Ambulatory Visit: Payer: Self-pay | Admitting: Adult Health

## 2022-03-27 DIAGNOSIS — I10 Essential (primary) hypertension: Secondary | ICD-10-CM

## 2022-03-30 ENCOUNTER — Other Ambulatory Visit: Payer: Self-pay | Admitting: Adult Health

## 2022-03-30 DIAGNOSIS — I10 Essential (primary) hypertension: Secondary | ICD-10-CM

## 2022-04-19 ENCOUNTER — Ambulatory Visit: Payer: Medicare Other | Admitting: Adult Health

## 2022-04-20 ENCOUNTER — Other Ambulatory Visit: Payer: Medicare Other

## 2022-05-04 ENCOUNTER — Ambulatory Visit: Payer: Medicare Other | Admitting: Adult Health

## 2022-06-22 ENCOUNTER — Ambulatory Visit: Payer: Medicare Other | Admitting: Adult Health

## 2022-07-06 ENCOUNTER — Ambulatory Visit: Payer: Medicare PPO | Admitting: Adult Health

## 2023-04-22 ENCOUNTER — Other Ambulatory Visit: Payer: Medicare Other | Admitting: Nurse Practitioner

## 2023-06-14 ENCOUNTER — Encounter: Payer: Medicare PPO | Admitting: Nurse Practitioner

## 2023-06-17 ENCOUNTER — Encounter: Payer: Medicare PPO | Admitting: Adult Health

## 2023-06-17 ENCOUNTER — Encounter: Payer: Self-pay | Admitting: Adult Health

## 2023-06-17 DIAGNOSIS — Z0000 Encounter for general adult medical examination without abnormal findings: Secondary | ICD-10-CM

## 2023-06-17 NOTE — Progress Notes (Signed)
 This service is provided via telemedicine  No vital signs collected/recorded due to the encounter was a telemedicine visit.   Location of patient (ex: home, work):  Home   Patient consents to a telephone visit:  Yes, 06/17/23   Location of the provider (ex: office, home):  [facility]  Name of any referring provider:  Medina-Vargas, Monina C, NP   Names of all persons participating in the telemedicine service and their role in the encounter: Zannie Runkle B/CMA, Medina-Vargas, Monina C, NP , and patient  Time spent on call:  11 minutes

## 2023-06-17 NOTE — Progress Notes (Signed)
 Patient did not answer video visit.This encounter was created in error - please disregard.

## 2023-06-26 NOTE — Progress Notes (Addendum)
 This encounter was created in error - please disregard.

## 2023-06-26 NOTE — Addendum Note (Signed)
Addended by: Kenard Gower C on: 06/26/2023 04:07 PM   Modules accepted: Orders, Level of Service
